# Patient Record
Sex: Female | Born: 1980 | Race: Black or African American | Hispanic: No | Marital: Single | Smoking: Former smoker
Health system: Southern US, Community
[De-identification: ages and names within clinical notes are randomized; demographics above are authoritative.]

## PROBLEM LIST (undated history)

## (undated) DIAGNOSIS — R519 Headache, unspecified: Secondary | ICD-10-CM

## (undated) DIAGNOSIS — IMO0001 Reserved for inherently not codable concepts without codable children: Secondary | ICD-10-CM

## (undated) DIAGNOSIS — D649 Anemia, unspecified: Secondary | ICD-10-CM

## (undated) DIAGNOSIS — O039 Complete or unspecified spontaneous abortion without complication: Secondary | ICD-10-CM

## (undated) HISTORY — DX: Complete or unspecified spontaneous abortion without complication: O03.9

## (undated) HISTORY — DX: Reserved for inherently not codable concepts without codable children: IMO0001

## (undated) HISTORY — PX: NO PAST SURGERIES: SHX2092

---

## 2004-04-01 ENCOUNTER — Emergency Department (HOSPITAL_COMMUNITY): Admission: EM | Admit: 2004-04-01 | Discharge: 2004-04-01 | Payer: Self-pay | Admitting: Family Medicine

## 2005-08-24 ENCOUNTER — Emergency Department (HOSPITAL_COMMUNITY): Admission: EM | Admit: 2005-08-24 | Discharge: 2005-08-24 | Payer: Self-pay | Admitting: Emergency Medicine

## 2005-08-28 ENCOUNTER — Emergency Department: Payer: Self-pay | Admitting: Emergency Medicine

## 2005-09-02 ENCOUNTER — Emergency Department: Payer: Self-pay | Admitting: Emergency Medicine

## 2006-04-23 ENCOUNTER — Inpatient Hospital Stay: Payer: Self-pay

## 2006-04-23 ENCOUNTER — Observation Stay: Payer: Self-pay

## 2006-04-26 ENCOUNTER — Emergency Department: Payer: Self-pay | Admitting: Emergency Medicine

## 2006-04-27 ENCOUNTER — Ambulatory Visit: Payer: Self-pay | Admitting: Anesthesiology

## 2006-04-27 ENCOUNTER — Emergency Department: Payer: Self-pay | Admitting: Emergency Medicine

## 2008-07-25 IMAGING — CT CT HEAD WITHOUT CONTRAST
2 series · 16 of 30 positions shown, 20 images · non-contrast
Comparison: none

REASON FOR EXAM: Headache
COMMENTS:

PROCEDURE:     CT  - CT HEAD WITHOUT CONTRAST  - April 27, 2006  [DATE]
RESULT:     Axial, unenhanced head CT was obtained on an emergency basis.

[Series 2: without · axial · non-contrast · 0.39mm/px · z∈[+280,+400]mm · 13 of 28 slices shown, 17 images]
[im 2/28  brain]
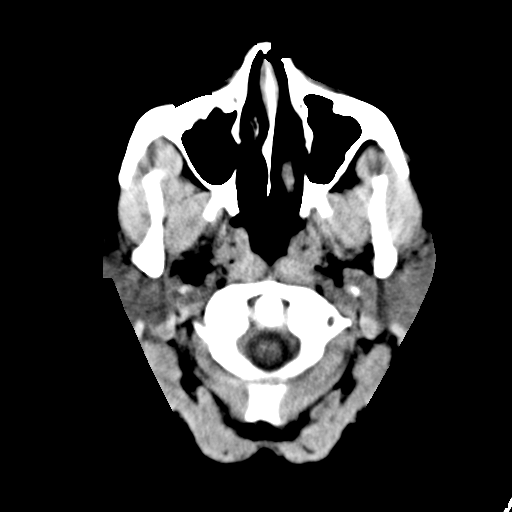
[im 2/28  bone]
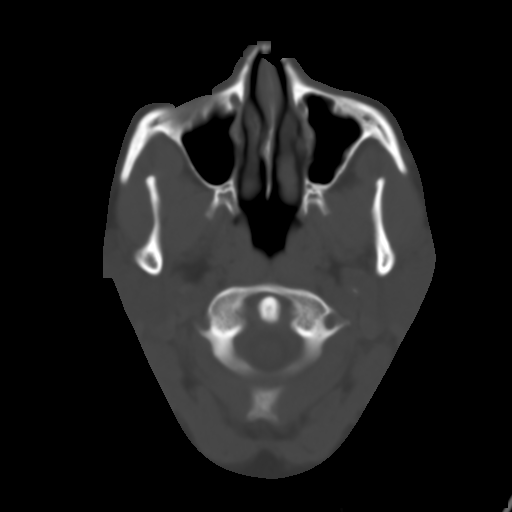
[im 4/28  brain]
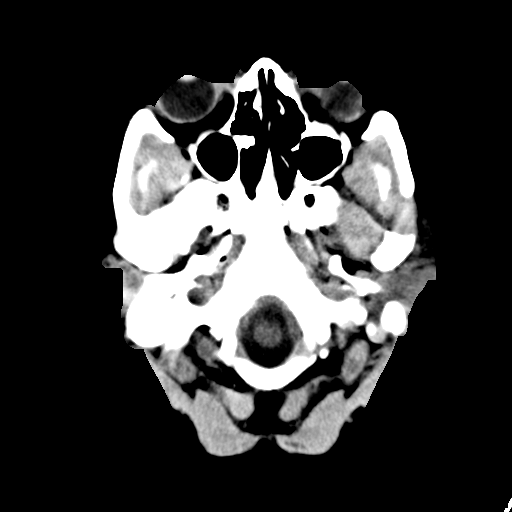
[im 6/28  brain]
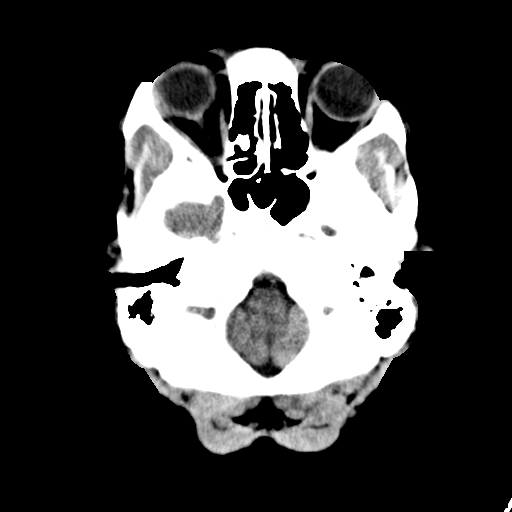
[im 8/28  brain]
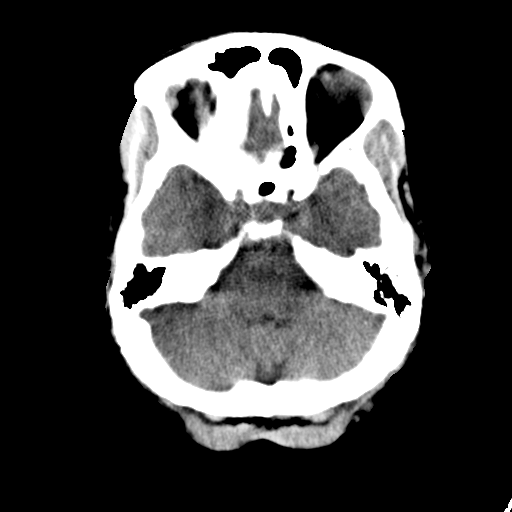
[im 10/28  brain]
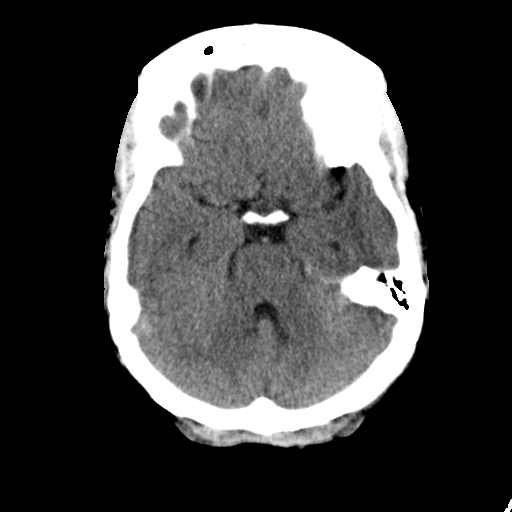
[im 10/28  bone]
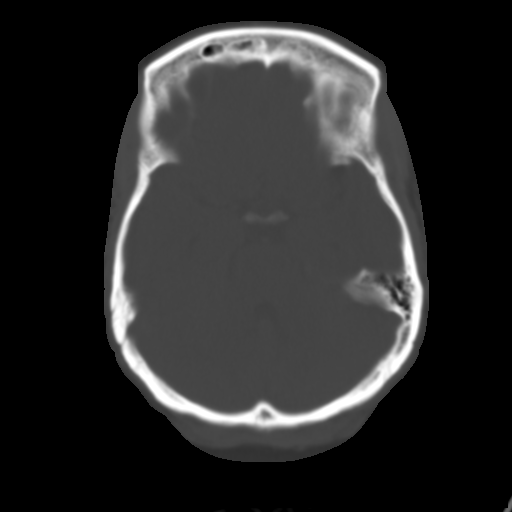
[im 12/28  brain]
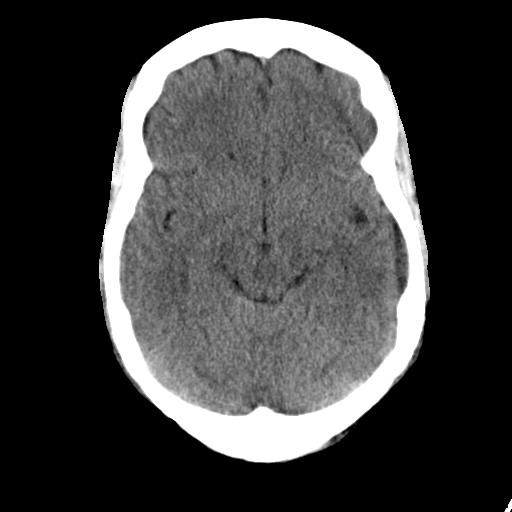
[im 14/28  brain]
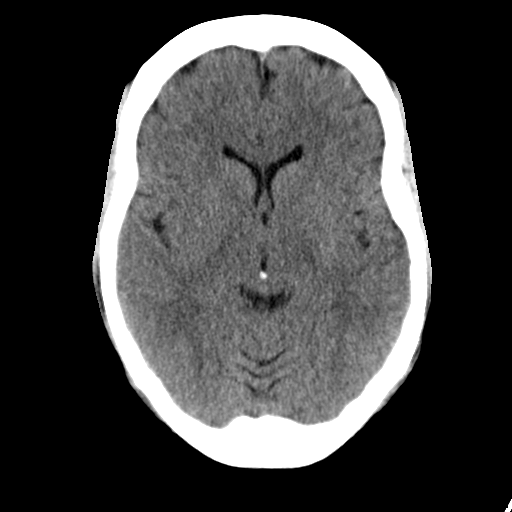
[im 16/28  brain]
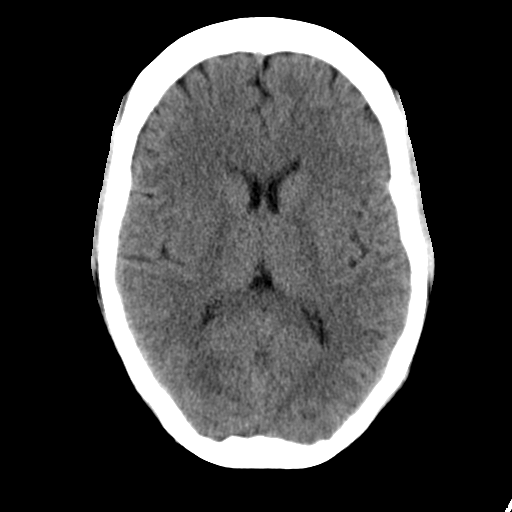
[im 18/28  brain]
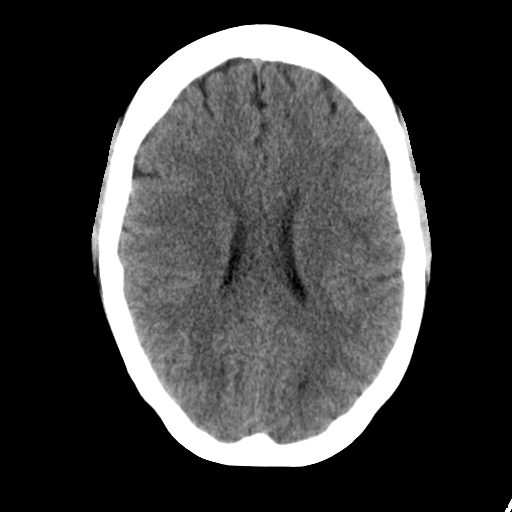
[im 18/28  bone]
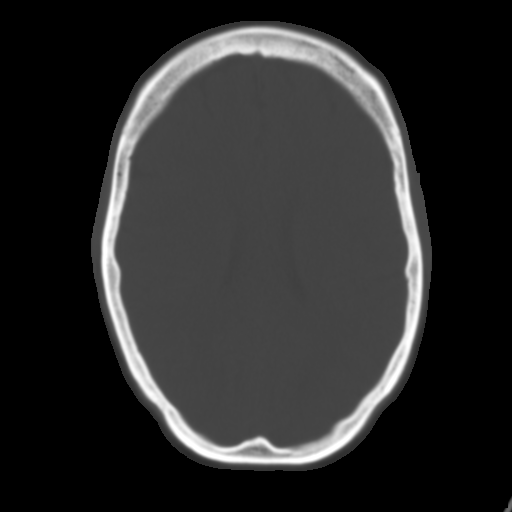
[im 20/28  brain]
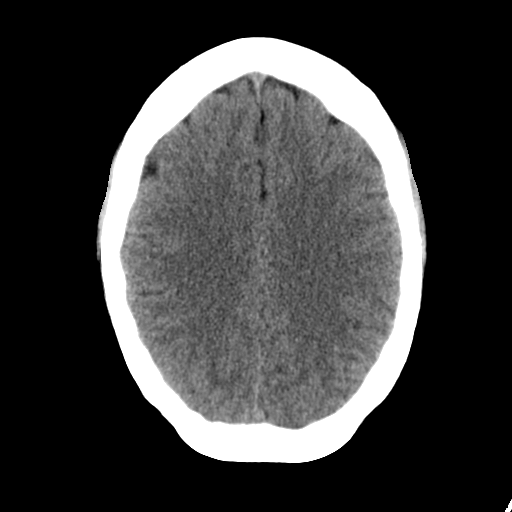
[im 22/28  brain]
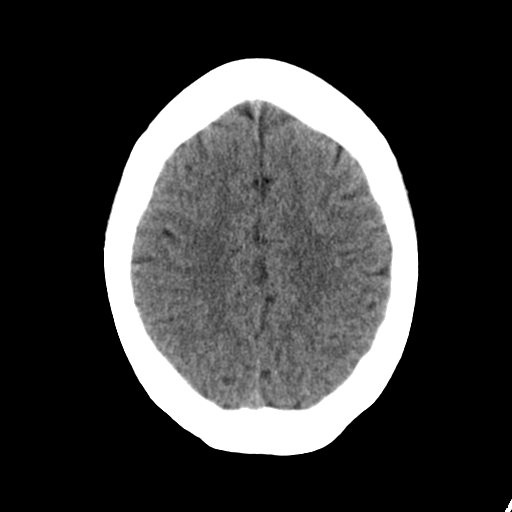
[im 24/28  brain]
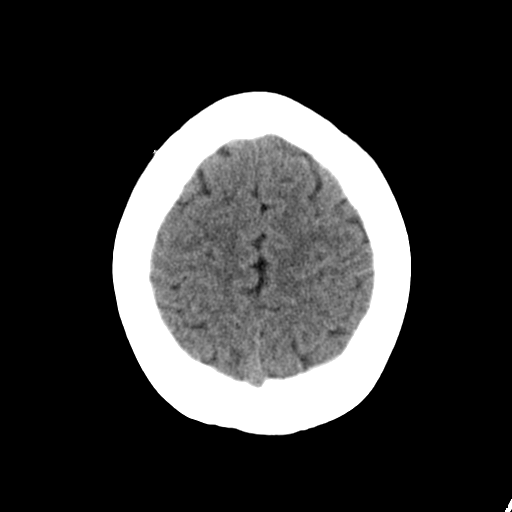
[im 26/28  brain]
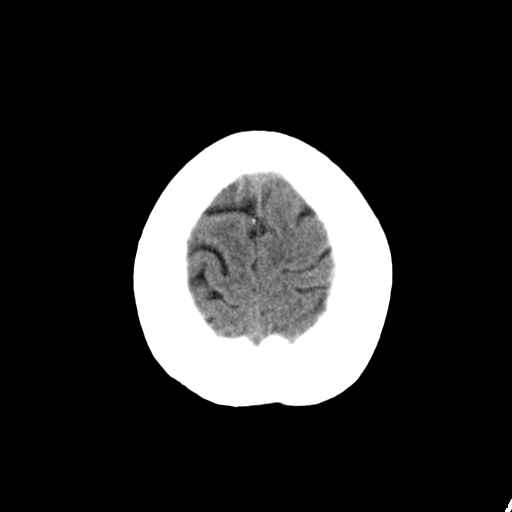
[im 26/28  bone]
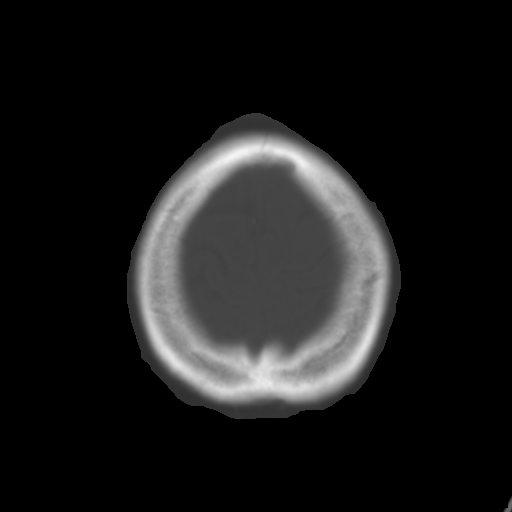

[Series 3: bone · axial · 0.39mm/px · z∈[+280,+320]mm · 3 of 28 slices shown]
[im 2/28  bone]
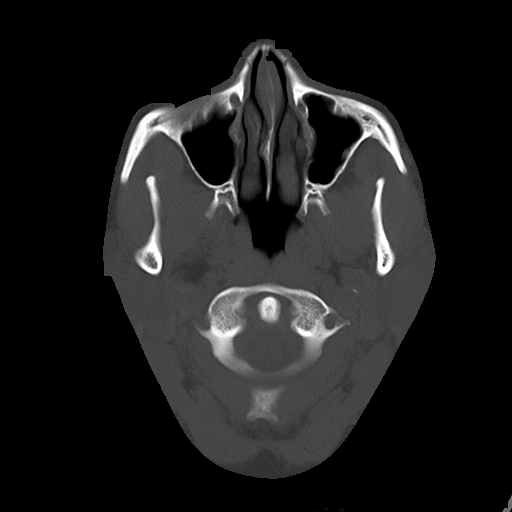
[im 6/28  bone]
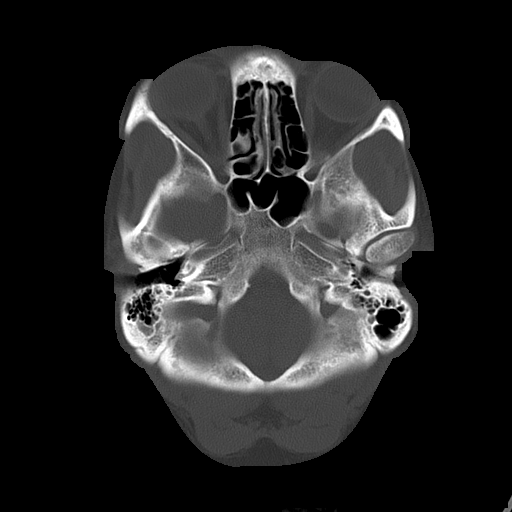
[im 10/28  bone]
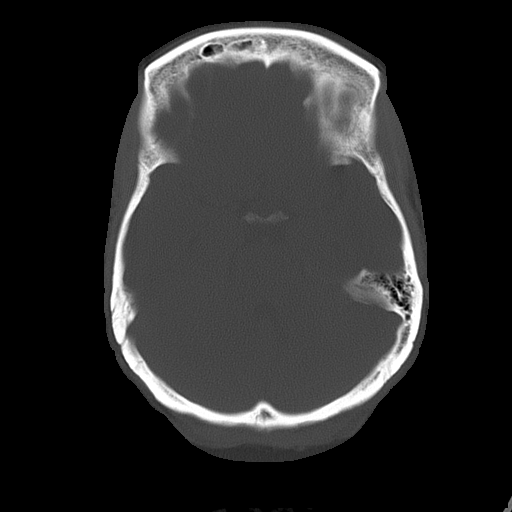

[16 of 30 positions shown; findings below may reference images not displayed]

FINDINGS: No subarachnoid hemorrhage is seen. There are no intracerebral
bleeds. No mass effect or shift of the midline is identified. No extra-axial
fluid collections are noted. The ventricles appear within normal limits.

On the bone window settings, there is some minimal mucoperiosteal thickening
in the LEFT maxillary sinus. The remainder of the sinuses is clear.
IMPRESSION: 1.     No intracranial abnormalities are identified.
2.     Chronic LEFT maxillary sinusitis.

## 2009-07-30 ENCOUNTER — Emergency Department: Payer: Self-pay | Admitting: Emergency Medicine

## 2010-11-10 ENCOUNTER — Emergency Department: Payer: Self-pay | Admitting: Unknown Physician Specialty

## 2011-04-07 ENCOUNTER — Emergency Department: Payer: Self-pay | Admitting: Emergency Medicine

## 2012-12-09 ENCOUNTER — Emergency Department: Payer: Self-pay | Admitting: Emergency Medicine

## 2012-12-09 LAB — URINALYSIS, COMPLETE
Glucose,UR: NEGATIVE mg/dL (ref 0–75)
Ketone: NEGATIVE
Leukocyte Esterase: NEGATIVE
Nitrite: NEGATIVE
Ph: 7 (ref 4.5–8.0)
Protein: NEGATIVE
Squamous Epithelial: 1
WBC UR: 1 /HPF (ref 0–5)

## 2017-03-02 ENCOUNTER — Ambulatory Visit: Payer: Self-pay | Attending: Oncology

## 2017-04-13 ENCOUNTER — Ambulatory Visit: Payer: Self-pay

## 2017-04-20 ENCOUNTER — Ambulatory Visit: Payer: Self-pay | Attending: Oncology

## 2019-02-16 NOTE — L&D Delivery Note (Signed)
Delivery Note Pt pushed x 17 minutes w/ Dr. Macon Large at [facility]. Late decels w/ moderate variability persisted, but pt pushed very well. NICU team at [facility]. At 12:39 PM a viable and healthy female was delivered via Vaginal, Spontaneous (Presentation: Right Occiput Anterior).  APGAR: 6, 9; weight 6 lb 3.3 oz (2815 g).  Cord clamped x 2 immediately after delivery and baby taken to warmer for NICU eval due to decels, but returned to Mom's abd for Skin-to-skin soon after.    Placenta status: Spontaneous;Pathology, Intact.  Cord: 3 vessels with the following complications: None.  Cord pH: pending.  Edematous cervix visable at introitus.   Anesthesia: Epidural Episiotomy: None Lacerations: None Suture Repair: NA Est. Blood Loss (mL): 50  IO cath 50 ml amber urine.  - CMET - LR bolus - CTO urine OP closely   Mom to postpartum.  Baby to [facility] 11/25/2019, 3:19 PM

## 2019-05-24 ENCOUNTER — Ambulatory Visit (INDEPENDENT_AMBULATORY_CARE_PROVIDER_SITE_OTHER): Payer: Medicaid Other | Admitting: Advanced Practice Midwife

## 2019-05-24 ENCOUNTER — Ambulatory Visit (INDEPENDENT_AMBULATORY_CARE_PROVIDER_SITE_OTHER): Payer: Medicaid Other

## 2019-05-24 ENCOUNTER — Encounter: Payer: Self-pay | Admitting: Advanced Practice Midwife

## 2019-05-24 ENCOUNTER — Other Ambulatory Visit (HOSPITAL_COMMUNITY)
Admission: RE | Admit: 2019-05-24 | Discharge: 2019-05-24 | Disposition: A | Discharge status: Home / Self Care | Payer: Medicaid Other | Source: Ambulatory Visit | Attending: Advanced Practice Midwife | Admitting: Advanced Practice Midwife

## 2019-05-24 ENCOUNTER — Other Ambulatory Visit: Payer: Self-pay

## 2019-05-24 VITALS — BP 118/74 | Ht 60.0 in | Wt 124.0 lb

## 2019-05-24 DIAGNOSIS — O0991 Supervision of high risk pregnancy, unspecified, first trimester: Secondary | ICD-10-CM | POA: Diagnosis present

## 2019-05-24 DIAGNOSIS — O09891 Supervision of other high risk pregnancies, first trimester: Secondary | ICD-10-CM | POA: Diagnosis not present

## 2019-05-24 DIAGNOSIS — Z3689 Encounter for other specified antenatal screening: Secondary | ICD-10-CM | POA: Diagnosis not present

## 2019-05-24 DIAGNOSIS — O09521 Supervision of elderly multigravida, first trimester: Secondary | ICD-10-CM | POA: Diagnosis not present

## 2019-05-24 DIAGNOSIS — Z113 Encounter for screening for infections with a predominantly sexual mode of transmission: Secondary | ICD-10-CM | POA: Diagnosis present

## 2019-05-24 DIAGNOSIS — Z3A14 14 weeks gestation of pregnancy: Secondary | ICD-10-CM

## 2019-05-24 DIAGNOSIS — O09529 Supervision of elderly multigravida, unspecified trimester: Secondary | ICD-10-CM | POA: Insufficient documentation

## 2019-05-24 DIAGNOSIS — Z3A13 13 weeks gestation of pregnancy: Secondary | ICD-10-CM

## 2019-05-24 NOTE — Progress Notes (Signed)
NOB today.  LMP 02/22/2019

## 2019-05-24 NOTE — Progress Notes (Signed)
New Obstetric Patient H&P    Chief Complaint: "Desires prenatal care"   History of Present Illness: Patient is a 39 y.o. G3P1011 Not Hispanic or Latino female, presents with amenorrhea and positive home pregnancy test. Patient's last menstrual period was 02/18/2019 (within days). and based on her  LMP, her EDD is Estimated Date of Delivery: 11/25/19 and her EGA is [redacted]w[redacted]d. Cycles are 4. days, regular, and occur approximately every : 28 days. By dating scan today patient is 105w4d. No adjustment of EDD for 7 day difference. Her last pap smear was 1 week ago at [facility]. Awaiting results. She reports a history of abnormal PAP in the past followed by normal.   She had a urine pregnancy test which was positive 7 week(s)  ago. Her last menstrual period was normal and lasted for  4 day(s). Since her LMP she claims she has experienced breast tenderness, fatigue, nausea, vomiting. She denies vaginal bleeding. Her past medical history is noncontributory. Her prior pregnancies are notable for G1 TAB in 2000, G2 FT SVD 5#6oz Female 2008  Since her LMP, she admits to the use of tobacco products  Yes currently 5 c/d/trying to quit She claims she has gained   5 pounds since the start of her pregnancy.  There are cats in the home in the home  no  She admits close contact with children on a regular basis  yes  She has had chicken pox in the past yes She has had Tuberculosis exposures, symptoms, or previously tested positive for TB   no Current or past history of domestic violence. no  Genetic Screening/Teratology Counseling: (Includes patient, baby's father, or anyone in either family with:)   1. Patient's age >/= 5 at [facility]  yes 2. Thalassemia ([place], [place], Mediterranean, or Asian background): MCV<80  no 3. Neural tube defect (meningomyelocele, spina bifida, anencephaly)  no 4. Congenital heart defect  no  5. Down syndrome  no 6. Tay-Sachs (Jewish, [place]  no 7. Canavan's Disease  no 8.  Sickle cell disease or trait (African)  no  9. Hemophilia or other blood disorders  no  10. Muscular dystrophy  no  11. Cystic fibrosis  no  12. Huntington's Chorea  no  13. Mental retardation/autism  no 14. Other inherited genetic or chromosomal disorder  no 15. Maternal metabolic disorder (DM, PKU, etc)  no 16. Patient or FOB with a child with a birth defect not listed above no  16a. Patient or FOB with a birth defect themselves no 17. Recurrent pregnancy loss, or stillbirth  no  18. Any medications since LMP other than prenatal vitamins (include vitamins, supplements, OTC meds, drugs, alcohol)  no 19. Any other genetic/environmental exposure to discuss  no  Infection History:   1. Lives with someone with TB or TB exposed  no  2. Patient or partner has history of genital herpes  no 3. Rash or viral illness since LMP  no 4. History of STI (GC, CT, HPV, syphilis, HIV)  no 5. 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 Supervision of high risk pregnancy in first trimester 05/24/2019    Clinic [facility] Prenatal Labs  Dating  Blood type:     Genetic Screen 1 Screen:  AFP:     Quad:     NIPS: Antibody:   Anatomic [place]  Rubella:   Varicella: @VZVIGG @  GTT Third trimester:  RPR:     Rhogam  HBsAg:     Vaccines TDAP:                       Flu Shot: HIV:     Baby Food                                GBS:   Contraception  Pap: April 2021 @ May 2021  CBB     CS/VBAC NA AMA  Support Person [place]       . Advanced maternal age in multigravida 05/24/2019    Past Surgical History:  Past Surgical History:  Procedure Laterality Date  . NO PAST SURGERIES      Gynecologic History: Patient's last menstrual period was 02/18/2019 (within days).  Obstetric History:  G3P1011  Family History:  History reviewed. No pertinent family history.  Social History:  Social History   Socioeconomic History  . Marital status: Single    Spouse name: Not on file  . Number of children: Not on file  . Years of education: Not on file  . Highest education level: Not on file  Occupational History  . Not on file  Tobacco Use  . Smoking status: Current Every Day Smoker    Types: Cigarettes  . Smokeless tobacco: Never Used  Substance and Sexual Activity  . Alcohol use: Not Currently  . Drug use: Not Currently  . Sexual activity: Yes    Birth control/protection: None  Other Topics Concern  . Not on file  Social History Narrative  . Not on file   Social Determinants of Health   Financial Resource Strain:   . Difficulty of Paying Living Expenses:   Food Insecurity:   . Worried About 04/18/2019 in the Last Year:   . Programme researcher, broadcasting/film/video in the Last Year:   Transportation Needs:   . Barista (Medical):   Freight forwarder Lack of Transportation (Non-Medical):   Physical Activity:   . Days of Exercise per Week:   . Minutes of Exercise per Session:   Stress:   . Feeling of Stress :   Social Connections:   . Frequency of Communication with Friends and Family:   . Frequency of Social Gatherings with Friends and Family:   . Attends Religious Services:   . Active Member of Clubs or Organizations:   . Attends [employer] Meetings:   Banker Marital Status:   Intimate Partner Violence:   . Fear of Current or Ex-Partner:   . Emotionally Abused:   [employer] Physically Abused:   . Sexually Abused:     Allergies:  Allergies  Allergen Reactions  . Penicillins     Medications: Prior to Admission medications   Medication Sig Start Date End Date Taking? Authorizing Provider  Prenatal Vit-Fe Fumarate-FA (PRENATAL VITAMIN PO) Take by mouth.   Yes [provider]    Physical Exam Vitals: Blood pressure 118/74, height 5' (1.524 m), weight 124 lb  (56.2 kg), last menstrual period 02/18/2019.  General: NAD HEENT: normocephalic, anicteric Thyroid: no enlargement, no palpable nodules Pulmonary: No increased work of breathing, CTAB Cardiovascular: RRR, distal pulses 2+ Abdomen: NABS, soft, non-tender, non-distended.  Umbilicus without lesions.  No hepatomegaly, splenomegaly or masses palpable.  No evidence of hernia, FHTs 140s  Genitourinary:  External: Normal external female genitalia.  Normal urethral meatus, normal  Bartholin's and Skene's glands.    Vagina: Normal vaginal mucosa, no evidence of prolapse.    Cervix: Grossly normal in appearance, no bleeding, no CMT  Uterus: Enlarged, mobile, normal contour.    Adnexa: ovaries non-enlarged, no adnexal masses  Rectal: deferred Extremities: no edema, erythema, or tenderness Neurologic: Grossly intact Psychiatric: mood appropriate, affect full  The following were addressed during this visit:  Breastfeeding Education - Early initiation of breastfeeding    Comments: Keeps milk supply adequate, helps contract uterus and slow bleeding, and early milk is the perfect first food and is easy to digest.   - The importance of exclusive breastfeeding    Comments: Provides antibodies, Lower risk of breast and ovarian cancers, and type-2 diabetes,Helps your body recover, Reduced chance of SIDS.   - Risks of giving your baby anything other than breast milk if you are breastfeeding    Comments: Make the baby less content with breastfeeds, may make my baby more susceptible to illness, and may reduce my milk supply.   - The importance of early skin-to-skin contact    Comments: Keeps baby warm and secure, helps keep baby's blood sugar up and breathing steady, easier to bond and breastfeed, and helps calm baby.  - Rooming-in on a 24-hour basis    Comments: Easier to learn baby's feeding cues, easier to bond and get to know each other, and encourages milk production.   - Feeding on demand or  baby-led feeding    Comments: Helps prevent breastfeeding complications, helps bring in good milk supply, prevents under or overfeeding, and helps baby feel content and satisfied   - Frequent feeding to help assure optimal milk production    Comments: Making a full supply of milk requires frequent removal of milk from breasts, infant will eat 8-12 times in 24 hours, if separated from infant use breast massage, hand expression and/ or pumping to remove milk from breasts.   - Effective positioning and attachment    Comments: Helps my baby to get enough breast milk, helps to produce an adequate milk supply, and helps prevent nipple pain and damage   - Exclusive breastfeeding for the first 6 months    Comments: Builds a healthy milk supply and keeps it up, protects baby from sickness and disease, and breastmilk has everything your baby needs for the first 6 months.    Assessment: 39 y.o. G3P1011 at [redacted]w[redacted]d presenting to initiate prenatal care  Plan: 1) Avoid alcoholic beverages. 2) Patient encouraged not to smoke.  3) Discontinue the use of all non-medicinal drugs and chemicals.  4) Take prenatal vitamins daily.  5) Nutrition, food safety (fish, cheese advisories, and high nitrite foods) and exercise discussed. 6) Hospital and practice style discussed with cross coverage system.  7) Genetic Screening, such as with 1st Trimester Screening, cell free fetal DNA, AFP testing, and Ultrasound, as well as with amniocentesis and CVS as appropriate, is discussed with patient. At the conclusion of today's visit patient requested cell free DNA genetic testing 8) Patient is asked about travel to areas at risk for the Zika virus, and counseled to avoid travel and exposure to mosquitoes or sexual partners who may have themselves been exposed to the virus. Testing is discussed, and will be ordered as appropriate.  9) ultrasound today for dating at 11:30, aptima, NOB panel, sickle cell screen, urine culture,  uds today. Will do  labs after u/s including MaterniT 21 if dating >10 weeks 10) Return to clinic in 4 weeks for rob   Rod Can, [facility] 05/24/2019, 10:32 AM

## 2019-05-24 NOTE — Patient Instructions (Signed)
Exercise During Pregnancy Exercise is an important part of being healthy for people of all ages. Exercise improves the function of your heart and lungs and helps you maintain strength, flexibility, and a healthy body weight. Exercise also boosts energy levels and elevates mood. Most women should exercise regularly during pregnancy. In rare cases, women with certain medical conditions or complications may be asked to limit or avoid exercise during pregnancy. How does this affect me? Along with maintaining general strength and flexibility, exercising during pregnancy can help:  Keep strength in muscles that are used during labor and childbirth.  Decrease low back pain.  Reduce symptoms of depression.  Control weight gain during pregnancy.  Reduce the risk of needing insulin if you develop diabetes during pregnancy.  Decrease the risk of cesarean delivery.  Speed up your recovery after giving birth. How does this affect my baby? Exercise can help you have a healthy pregnancy. Exercise does not cause premature birth. It will not cause your baby to weigh less at birth. What exercises can I do? Many exercises are safe for you to do during pregnancy. Do a variety of exercises that safely increase your heart and breathing rates and help you build and maintain muscle strength. Do exercises exactly as told by your health care provider. You may do these exercises:  Walking or hiking.  Swimming.  Water aerobics.  Riding a stationary bike.  Strength training.  Modified yoga or Pilates. Tell your instructor that you are pregnant. Avoid overstretching, and avoid lying on your back for long periods of time.  Running or jogging. Only choose this type of exercise if you: ? Ran or jogged regularly before your pregnancy. ? Can run or jog and still talk in complete sentences. What exercises should I avoid? Depending on your level of fitness and whether you exercised regularly before your  pregnancy, you may be told to limit high-intensity exercise. You can tell that you are exercising at a high intensity if you are breathing much harder and faster and cannot hold a conversation while exercising. You must avoid:  Contact sports.  Activities that put you at risk for falling on or being hit in the belly, such as downhill skiing, water skiing, surfing, rock climbing, cycling, gymnastics, and horseback riding.  Scuba diving.  Skydiving.  Yoga or Pilates in a room that is heated to high temperatures.  Jogging or running, unless you ran or jogged regularly before your pregnancy. While jogging or running, you should always be able to talk in full sentences. Do not run or jog so fast that you are unable to have a conversation.  Do not exercise at more than 6,000 feet above sea level (high elevation) if you are not used to exercising at high elevation. How do I exercise in a safe way?   Avoid overheating. Do not exercise in very high temperatures.  Wear loose-fitting, breathable clothes.  Avoid dehydration. Drink enough water before, during, and after exercise to keep your urine pale yellow.  Avoid overstretching. Because of hormone changes during pregnancy, it is easy to overstretch muscles, tendons, and ligaments during pregnancy.  Start slowly and ask your health care provider to recommend the types of exercise that are safe for you.  Do not exercise to lose weight. Follow these instructions at home:  Exercise on most days or all days of the week. Try to exercise for 30 minutes a day, 5 days a week, unless your health care provider tells you not to.  If   you actively exercised before your pregnancy and you are healthy, your health care provider may tell you to continue to do moderate to high-intensity exercise.  If you are just starting to exercise or did not exercise much before your pregnancy, your health care provider may tell you to do low to moderate-intensity  exercise. Questions to ask your health care provider  Is exercise safe for me?  What are signs that I should stop exercising?  Does my health condition mean that I should not exercise during pregnancy?  When should I avoid exercising during pregnancy? Stop exercising and contact a health care provider if: You have any unusual symptoms, such as:  Mild contractions of the uterus or cramps in the abdomen.  Dizziness that does not go away when you rest. Stop exercising and get help right away if: You have any unusual symptoms, such as:  Sudden, severe pain in your low back or your belly.  Mild contractions of the uterus or cramps in the abdomen that do not improve with rest and drinking fluids.  Chest pain.  Bleeding or fluid leaking from your vagina.  Shortness of breath. These symptoms may represent a serious problem that is an emergency. Do not wait to see if the symptoms will go away. Get medical help right away. Call your local emergency services (911 in the [place] Do not drive yourself to the hospital. Summary  Most women should exercise regularly throughout pregnancy. In rare cases, women with certain medical conditions or complications may be asked to limit or avoid exercise during pregnancy.  Do not exercise to lose weight during pregnancy.  Your health care provider will tell you what level of physical activity is right for you.  Stop exercising and contact a health care provider if you have mild contractions of the uterus or cramps in the abdomen. Get help right away if these contractions or cramps do not improve with rest and drinking fluids.  Stop exercising and get help right away if you have sudden, severe pain in your low back or belly, chest pain, shortness of breath, or bleeding or leaking of fluid from your vagina. This information is not intended to replace advice given to you by your health care provider. Make sure you discuss any questions you have with your  health care provider. Document Revised: 05/25/2018 Document Reviewed: 03/08/2018 Elsevier Patient Education  2020 [employer] Eating Plan for Pregnant Women While you are pregnant, your body requires additional nutrition to help support your growing baby. You also have a higher need for some vitamins and minerals, such as folic acid, calcium, iron, and vitamin D. Eating a healthy, well-balanced diet is very important for your health and your baby's health. Your need for extra calories varies for the three 3-month segments of your pregnancy (trimesters). For most women, it is recommended to consume:  150 extra calories a day during the first trimester.  300 extra calories a day during the second trimester.  300 extra calories a day during the third trimester. What are tips for following this plan?   Do not try to lose weight or go on a diet during pregnancy.  Limit your overall intake of foods that have "empty calories." These are foods that have little nutritional value, such as sweets, desserts, candies, and sugar-sweetened beverages.  Eat a variety of foods (especially fruits and vegetables) to get a full range of vitamins and minerals.  Take a prenatal vitamin to help meet your additional vitamin and mineral needs   during pregnancy, specifically for folic acid, iron, calcium, and vitamin D.  Remember to stay active. Ask your health care provider what types of exercise and activities are safe for you.  Practice good food safety and cleanliness. Wash your hands before you eat and after you prepare raw meat. Wash all fruits and vegetables well before peeling or eating. Taking these actions can help to prevent food-borne illnesses that can be very dangerous to your baby, such as listeriosis. Ask your health care provider for more information about listeriosis. What does 150 extra calories look like? Healthy options that provide 150 extra calories each day could be any of the  following:  6-8 oz (170-230 g) of plain low-fat yogurt with  cup of berries.  1 apple with 2 teaspoons (11 g) of peanut butter.  Cut-up vegetables with  cup (60 g) of hummus.  8 oz (230 mL) or 1 cup of low-fat chocolate milk.  1 stick of string cheese with 1 medium orange.  1 peanut butter and jelly sandwich that is made with one slice of whole-wheat bread and 1 tsp (5 g) of peanut butter. For 300 extra calories, you could eat two of those healthy options each day. What is a healthy amount of weight to gain? The right amount of weight gain for you is based on your BMI before you became pregnant. If your BMI:  Was less than 18 (underweight), you should gain 28-40 lb (13-18 kg).  Was 18-24.9 (normal), you should gain 25-35 lb (11-16 kg).  Was 25-29.9 (overweight), you should gain 15-25 lb (7-11 kg).  Was 30 or greater (obese), you should gain 11-20 lb (5-9 kg). What if I am having twins or multiples? Generally, if you are carrying twins or multiples:  You may need to eat 300-600 extra calories a day.  The recommended range for total weight gain is 25-54 lb (11-25 kg), depending on your BMI before pregnancy.  Talk with your health care provider to find out about nutritional needs, weight gain, and exercise that is right for you. What foods can I eat?  Fruits All fruits. Eat a variety of colors and types of fruit. Remember to wash your fruits well before peeling or eating. Vegetables All vegetables. Eat a variety of colors and types of vegetables. Remember to wash your vegetables well before peeling or eating. Grains All grains. Choose whole grains, such as whole-wheat bread, oatmeal, or brown rice. Meats and other protein foods Lean meats, including chicken, turkey, fish, and lean cuts of beef, veal, or pork. If you eat fish or seafood, choose options that are higher in omega-3 fatty acids and lower in mercury, such as salmon, herring, mussels, trout, sardines, pollock,  shrimp, crab, and lobster. Tofu. Tempeh. Beans. Eggs. Peanut butter and other nut butters. Make sure that all meats, poultry, and eggs are cooked to food-safe temperatures or "well-done." Two or more servings of fish are recommended each week in order to get the most benefits from omega-3 fatty acids that are found in seafood. Choose fish that are lower in mercury. You can find more information online:  www.fda.gov Dairy Pasteurized milk and milk alternatives (such as almond milk). Pasteurized yogurt and pasteurized cheese. Cottage cheese. Sour cream. Beverages Water. Juices that contain 100% fruit juice or vegetable juice. Caffeine-free teas and decaffeinated coffee. Drinks that contain caffeine are okay to drink, but it is better to avoid caffeine. Keep your total caffeine intake to less than 200 mg each day (which is 12 oz   or 355 mL of coffee, tea, or soda) or the limit as told by your health care provider. Fats and oils Fats and oils are okay to include in moderation. Sweets and desserts Sweets and desserts are okay to include in moderation. Seasoning and other foods All pasteurized condiments. The items listed above may not be a complete list of foods and beverages you can eat. Contact a dietitian for more information. What foods are not recommended? Fruits Unpasteurized fruit juices. Vegetables Raw (unpasteurized) vegetable juices. Meats and other protein foods Lunch meats, bologna, hot dogs, or other deli meats. (If you must eat those meats, reheat them until they are steaming hot.) Refrigerated pat, meat spreads from a meat counter, smoked seafood that is found in the refrigerated section of a store. Raw or undercooked meats, poultry, and eggs. Raw fish, such as sushi or sashimi. Fish that have high mercury content, such as tilefish, shark, swordfish, and king mackerel. To learn more about mercury in fish, talk with your health care provider or look for online resources, such  as:  www.fda.gov Dairy Raw (unpasteurized) milk and any foods that have raw milk in them. Soft cheeses, such as feta, queso blanco, queso fresco, Brie, Camembert cheeses, blue-veined cheeses, and Panela cheese (unless it is made with pasteurized milk, which must be stated on the label). Beverages Alcohol. Sugar-sweetened beverages, such as sodas, teas, or energy drinks. Seasoning and other foods Homemade fermented foods and drinks, such as pickles, sauerkraut, or kombucha drinks. (Store-bought pasteurized versions of these are okay.) Salads that are made in a store or deli, such as ham salad, chicken salad, egg salad, tuna salad, and seafood salad. The items listed above may not be a complete list of foods and beverages you should avoid. Contact a dietitian for more information. Where to find more information To calculate the number of calories you need based on your height, weight, and activity level, you can use an online calculator such as:  www.choosemyplate.gov/MyPlatePlan To calculate how much weight you should gain during pregnancy, you can use an online pregnancy weight gain calculator such as:  www.choosemyplate.gov/pregnancy-weight-gain-calculator Summary  While you are pregnant, your body requires additional nutrition to help support your growing baby.  Eat a variety of foods, especially fruits and vegetables to get a full range of vitamins and minerals.  Practice good food safety and cleanliness. Wash your hands before you eat and after you prepare raw meat. Wash all fruits and vegetables well before peeling or eating. Taking these actions can help to prevent food-borne illnesses, such as listeriosis, that can be very dangerous to your baby.  Do not eat raw meat or fish. Do not eat fish that have high mercury content, such as tilefish, shark, swordfish, and king mackerel. Do not eat unpasteurized (raw) dairy.  Take a prenatal vitamin to help meet your additional vitamin and  mineral needs during pregnancy, specifically for folic acid, iron, calcium, and vitamin D. This information is not intended to replace advice given to you by your health care provider. Make sure you discuss any questions you have with your health care provider. Document Revised: 06/22/2018 Document Reviewed: 10/29/2016 Elsevier Patient Education  2020 Else[employer] Inc. Prenatal Care Prenatal care is health care during pregnancy. It helps you and your unborn baby (fetus) stay as healthy as possible. Prenatal care may be provided by a midwife, a family practice health care provider, or a childbirth and pregnancy specialist (obstetrician). How does this affect me? During pregnancy, you will be closely monitored   for any new conditions that might develop. To lower your risk of pregnancy complications, you and your health care provider will talk about any underlying conditions you have. How does this affect my baby? Early and consistent prenatal care increases the chance that your baby will be healthy during pregnancy. Prenatal care lowers the risk that your baby will be:  Born early (prematurely).  Smaller than expected at birth (small for gestational age). What can I expect at the first prenatal care visit? Your first prenatal care visit will likely be the longest. You should schedule your first prenatal care visit as soon as you know that you are pregnant. Your first visit is a good time to talk about any questions or concerns you have about pregnancy. At your visit, you and your health care provider will talk about:  Your medical history, including: ? Any past pregnancies. ? Your family's medical history. ? The baby's father's medical history. ? Any long-term (chronic) health conditions you have and how you manage them. ? Any surgeries or procedures you have had. ? Any current over-the-counter or prescription medicines, herbs, or supplements you are taking.  Other factors that could pose a risk  to your baby, including:  Your home setting and your stress levels, including: ? Exposure to abuse or violence. ? Household financial strain. ? Mental health conditions you have.  Your daily health habits, including diet and exercise. Your health care provider will also:  Measure your weight, height, and blood pressure.  Do a physical exam, including a pelvic and breast exam.  Perform blood tests and urine tests to check for: ? Urinary tract infection. ? Sexually transmitted infections (STIs). ? Low iron levels in your blood (anemia). ? Blood type and certain proteins on red blood cells (Rh antibodies). ? Infections and immunity to viruses, such as hepatitis B and rubella. ? HIV (human immunodeficiency virus).  Do an ultrasound to confirm your baby's growth and development and to help predict your estimated due date (EDD). This ultrasound is done with a probe that is inserted into the vagina (transvaginal ultrasound).  Discuss your options for genetic screening.  Give you information about how to keep yourself and your baby healthy, including: ? Nutrition and taking vitamins. ? Physical activity. ? How to manage pregnancy symptoms such as nausea and vomiting (morning sickness). ? Infections and substances that may be harmful to your baby and how to avoid them. ? Food safety. ? Dental care. ? Working. ? Travel. ? Warning signs to watch for and when to call your health care provider. How often will I have prenatal care visits? After your first prenatal care visit, you will have regular visits throughout your pregnancy. The visit schedule is often as follows:  Up to week 28 of pregnancy: once every 4 weeks.  28-36 weeks: once every 2 weeks.  After 36 weeks: every week until delivery. Some women may have visits more or less often depending on any underlying health conditions and the health of the baby. Keep all follow-up and prenatal care visits as told by your health care  provider. This is important. What happens during routine prenatal care visits? Your health care provider will:  Measure your weight and blood pressure.  Check for fetal heart sounds.  Measure the height of your uterus in your abdomen (fundal height). This may be measured starting around week 20 of pregnancy.  Check the position of your baby inside your uterus.  Ask questions about your diet, sleeping patterns, and   whether you can feel the baby move.  Review warning signs to watch for and signs of labor.  Ask about any pregnancy symptoms you are having and how you are dealing with them. Symptoms may include: ? Headaches. ? Nausea and vomiting. ? Vaginal discharge. ? Swelling. ? Fatigue. ? Constipation. ? Any discomfort, including back or pelvic pain. Make a list of questions to ask your health care provider at your routine visits. What tests might I have during prenatal care visits? You may have blood, urine, and imaging tests throughout your pregnancy, such as:  Urine tests to check for glucose, protein, or signs of infection.  Glucose tests to check for a form of diabetes that can develop during pregnancy (gestational diabetes mellitus). This is usually done around week 24 of pregnancy.  An ultrasound to check your baby's growth and development and to check for birth defects. This is usually done around week 20 of pregnancy.  A test to check for group B strep (GBS) infection. This is usually done around week 36 of pregnancy.  Genetic testing. This may include blood or imaging tests, such as an ultrasound. Some genetic tests are done during the first trimester and some are done during the second trimester. What else can I expect during prenatal care visits? Your health care provider may recommend getting certain vaccines during pregnancy. These may include:  A yearly flu shot (annual influenza vaccine). This is especially important if you will be pregnant during flu  season.  Tdap (tetanus, diphtheria, pertussis) vaccine. Getting this vaccine during pregnancy can protect your baby from whooping cough (pertussis) after birth. This vaccine may be recommended between weeks 27 and 36 of pregnancy. Later in your pregnancy, your health care provider may give you information about:  Childbirth and breastfeeding classes.  Choosing a health care provider for your baby.  Umbilical cord banking.  Breastfeeding.  Birth control after your baby is born.  The hospital labor and delivery unit and how to tour it.  Registering at the hospital before you go into labor. Where to find more information  Office on Women's Health: womenshealth.gov  American Pregnancy Association: americanpregnancy.org  March of Dimes: marchofdimes.org Summary  Prenatal care helps you and your baby stay as healthy as possible during pregnancy.  Your first prenatal care visit will most likely be the longest.  You will have visits and tests throughout your pregnancy to monitor your health and your baby's health.  Bring a list of questions to your visits to ask your health care provider.  Make sure to keep all follow-up and prenatal care visits with your health care provider. This information is not intended to replace advice given to you by your health care provider. Make sure you discuss any questions you have with your health care provider. Document Revised: 05/24/2018 Document Reviewed: 01/31/2017 Elsevier Patient Education  2020 Elsevier Inc.  

## 2019-05-26 LAB — URINE CULTURE

## 2019-05-28 LAB — CERVICOVAGINAL ANCILLARY ONLY
Chlamydia: NEGATIVE
Comment: NEGATIVE
Comment: NEGATIVE
Comment: NORMAL
Neisseria Gonorrhea: NEGATIVE
Trichomonas: POSITIVE — AB

## 2019-05-29 LAB — RPR+RH+ABO+RUB AB+AB SCR+CB...
Antibody Screen: NEGATIVE
HIV Screen 4th Generation wRfx: NONREACTIVE
Hematocrit: 34.8 % (ref 34.0–46.6)
Hemoglobin: 11.8 g/dL (ref 11.1–15.9)
Hepatitis B Surface Ag: NEGATIVE
MCH: 32.2 pg (ref 26.6–33.0)
MCHC: 33.9 g/dL (ref 31.5–35.7)
MCV: 95 fL (ref 79–97)
Platelets: 338 10*3/uL (ref 150–450)
RBC: 3.66 x10E6/uL — ABNORMAL LOW (ref 3.77–5.28)
RDW: 12.4 % (ref 11.7–15.4)
RPR Ser Ql: NONREACTIVE
Rh Factor: POSITIVE
Rubella Antibodies, IGG: 8.18 index (ref >0.99)
Varicella zoster IgG: 1192 index (ref >165)
WBC: 8.7 10*3/uL (ref 3.4–10.8)

## 2019-05-29 LAB — MATERNIT 21 PLUS CORE, BLOOD
Fetal Fraction: 10
Result (T21): NEGATIVE
Trisomy 13 (Patau syndrome): NEGATIVE
Trisomy 18 (Edwards syndrome): NEGATIVE
Trisomy 21 (Down syndrome): NEGATIVE

## 2019-05-29 LAB — HGB FRACTIONATION CASCADE
Hgb A2: 2.8 % (ref 1.8–3.2)
Hgb A: 97.2 % (ref 96.4–98.8)
Hgb F: 0 % (ref 0.0–2.0)
Hgb S: 0 %

## 2019-05-29 LAB — URINE DRUG PANEL 7
Amphetamines, Urine: NEGATIVE ng/mL
Barbiturate Quant, Ur: NEGATIVE ng/mL
Benzodiazepine Quant, Ur: NEGATIVE ng/mL
Cannabinoid Quant, Ur: POSITIVE — AB
Cocaine (Metab.): NEGATIVE ng/mL
Opiate Quant, Ur: NEGATIVE ng/mL
PCP Quant, Ur: NEGATIVE ng/mL

## 2019-05-29 NOTE — Telephone Encounter (Signed)
Can you send in a rx to her pharmacy?

## 2019-05-30 ENCOUNTER — Other Ambulatory Visit: Payer: Self-pay | Admitting: Advanced Practice Midwife

## 2019-05-30 DIAGNOSIS — A599 Trichomoniasis, unspecified: Secondary | ICD-10-CM

## 2019-05-30 MED ORDER — METRONIDAZOLE 500 MG PO TABS: 2000.0000 mg | ORAL_TABLET | Freq: Once | ORAL | 0 refills | Status: AC

## 2019-05-30 NOTE — Progress Notes (Unsigned)
Rx metronidazole sent to patient's pharmacy. Spoke with patient and reviewed all lab results and instructions given.

## 2019-06-21 ENCOUNTER — Encounter: Payer: Medicaid Other | Admitting: Advanced Practice Midwife

## 2019-06-28 ENCOUNTER — Other Ambulatory Visit: Payer: Self-pay

## 2019-06-28 ENCOUNTER — Ambulatory Visit (INDEPENDENT_AMBULATORY_CARE_PROVIDER_SITE_OTHER): Payer: Medicaid Other | Admitting: Advanced Practice Midwife

## 2019-06-28 ENCOUNTER — Encounter: Payer: Self-pay | Admitting: Advanced Practice Midwife

## 2019-06-28 VITALS — BP 110/70 | Wt 129.0 lb

## 2019-06-28 DIAGNOSIS — O09522 Supervision of elderly multigravida, second trimester: Secondary | ICD-10-CM

## 2019-06-28 DIAGNOSIS — R519 Headache, unspecified: Secondary | ICD-10-CM

## 2019-06-28 DIAGNOSIS — O26892 Other specified pregnancy related conditions, second trimester: Secondary | ICD-10-CM

## 2019-06-28 DIAGNOSIS — Z3A18 18 weeks gestation of pregnancy: Secondary | ICD-10-CM

## 2019-06-28 DIAGNOSIS — O0992 Supervision of high risk pregnancy, unspecified, second trimester: Secondary | ICD-10-CM

## 2019-06-28 LAB — POCT URINALYSIS DIPSTICK OB
Glucose, UA: NEGATIVE
POC,PROTEIN,UA: NEGATIVE

## 2019-06-28 MED ORDER — BUTALBITAL-APAP-CAFFEINE 50-325-40 MG PO CAPS: 1.0000 | ORAL_CAPSULE | Freq: Four times a day (QID) | ORAL | 2 refills | Status: DC | PRN

## 2019-06-28 NOTE — Progress Notes (Signed)
  Routine Prenatal Care Visit  Subjective  Olivia Sandoval is a 39 y.o. G3P1011 at [redacted]w[redacted]d being seen today for ongoing prenatal care.  She is currently monitored for the following issues for this high-risk pregnancy and has Supervision of high risk pregnancy in first trimester and Advanced maternal age in multigravida on their problem list.  ----------------------------------------------------------------------------------- Patient reports continued headaches 2 or 3 days per week. She admits staying hydrated. She does not think they are sinus related. They are mostly frontal tension type per her report. We discussed adequate sleep and hydration. She accepts Rx for Fioricet.    . Vag. Bleeding: None.  Movement: Present. Leaking Fluid denies.  ----------------------------------------------------------------------------------- The following portions of the patient's history were reviewed and updated as appropriate: allergies, current medications, past family history, past medical history, past social history, past surgical history and problem list. Problem list updated.  Objective  Blood pressure 110/70, weight 129 lb (58.5 kg), last menstrual period 02/18/2019. Pregravid weight 119 lb (54 kg) Total Weight Gain 10 lb (4.536 kg) Urinalysis: Urine Protein    Urine Glucose    Fetal Status: Fetal Heart Rate (bpm): 150   Movement: Present     General:  Alert, oriented and cooperative. Patient is in no acute distress.  Skin: Skin is warm and dry. No rash noted.   Cardiovascular: Normal heart rate noted  Respiratory: Normal respiratory effort, no problems with respiration noted  Abdomen: Soft, gravid, appropriate for gestational age.       Pelvic:  Cervical exam deferred        Extremities: Normal range of motion.  Edema: None  Mental Status: Normal mood and affect. Normal behavior. Normal judgment and thought content.   Assessment   39 y.o. G3P1011 at [redacted]w[redacted]d by  11/25/2019, by Last Menstrual Period  presenting for routine prenatal visit  Plan   pregnancy Problems (from 05/24/19 to present)    Problem Noted Resolved   Advanced maternal age in multigravida 05/24/2019 by Tresea Mall, CNM No    Headaches: Rx Fioricet, hydration, adequate sleep   Preterm labor symptoms and general obstetric precautions including but not limited to vaginal bleeding, contractions, leaking of fluid and fetal movement were reviewed in detail with the patient.   Return in about 2 weeks (around 07/12/2019) for anatomy scan and rob.  Tresea Mall, CNM 06/28/2019 4:06 PM

## 2019-06-28 NOTE — Progress Notes (Signed)
ROB- still having bad headaches

## 2019-06-28 NOTE — Addendum Note (Signed)
Addended by: Donnetta Hail on: 06/28/2019 04:28 PM   Modules accepted: Orders

## 2019-07-12 ENCOUNTER — Encounter: Payer: Medicaid Other | Admitting: Advanced Practice Midwife

## 2019-07-12 ENCOUNTER — Ambulatory Visit: Payer: Medicaid Other

## 2019-07-12 ENCOUNTER — Other Ambulatory Visit: Payer: Self-pay

## 2019-07-19 ENCOUNTER — Encounter: Payer: Self-pay | Admitting: Obstetrics & Gynecology

## 2019-07-19 ENCOUNTER — Ambulatory Visit (INDEPENDENT_AMBULATORY_CARE_PROVIDER_SITE_OTHER): Payer: Medicaid Other | Admitting: Obstetrics & Gynecology

## 2019-07-19 ENCOUNTER — Other Ambulatory Visit: Payer: Self-pay

## 2019-07-19 ENCOUNTER — Ambulatory Visit (INDEPENDENT_AMBULATORY_CARE_PROVIDER_SITE_OTHER): Payer: Medicaid Other

## 2019-07-19 VITALS — BP 120/80 | Wt 132.0 lb

## 2019-07-19 DIAGNOSIS — O0992 Supervision of high risk pregnancy, unspecified, second trimester: Secondary | ICD-10-CM

## 2019-07-19 DIAGNOSIS — O09522 Supervision of elderly multigravida, second trimester: Secondary | ICD-10-CM

## 2019-07-19 DIAGNOSIS — Z3A21 21 weeks gestation of pregnancy: Secondary | ICD-10-CM

## 2019-07-19 DIAGNOSIS — O0991 Supervision of high risk pregnancy, unspecified, first trimester: Secondary | ICD-10-CM

## 2019-07-19 NOTE — Patient Instructions (Signed)

## 2019-07-19 NOTE — Progress Notes (Signed)
  Subjective  Fetal Movement? yes Contractions? no Leaking Fluid? no Vaginal Bleeding? no  Objective  BP 120/80   Wt 132 lb (59.9 kg)   LMP 02/18/2019 (Within Days)   BMI 25.78 kg/m  General: NAD Pumonary: no increased work of breathing Abdomen: gravid, non-tender Extremities: no edema Psychiatric: mood appropriate, affect full  Review of ULTRASOUND. I have personally reviewed images and report of recent ultrasound done at [facility]. There is a singleton gestation with subjectively normal amniotic fluid volume. The fetal biometry correlates with established dating. Detailed evaluation of the fetal anatomy was performed.The fetal anatomical survey appears within normal limits within the resolution of ultrasound as described above.  It must be noted that a normal ultrasound is unable to rule out fetal aneuploidy.    Assessment  39 y.o. G3P1011 at [redacted]w[redacted]d by  11/25/2019, by Last Menstrual Period presenting for routine prenatal visit  Plan   Problem List Items Addressed This Visit      Other   Supervision of high risk pregnancy in first trimester   Advanced maternal age in multigravida    Other Visit Diagnoses    [redacted] weeks gestation of pregnancy    -  Primary   Supervision of high risk pregnancy in second trimester          pregnancy Problems (from 05/24/19 to present)    Problem Noted Resolved   Advanced maternal age in multigravida 05/24/2019 by Tresea Mall, CNM No       Annamarie Major, MD, [facility], [facility] 07/19/2019  3:27 PM

## 2019-08-16 ENCOUNTER — Ambulatory Visit (INDEPENDENT_AMBULATORY_CARE_PROVIDER_SITE_OTHER): Payer: Medicaid Other | Admitting: Obstetrics & Gynecology

## 2019-08-16 ENCOUNTER — Encounter: Payer: Self-pay | Admitting: Obstetrics & Gynecology

## 2019-08-16 ENCOUNTER — Other Ambulatory Visit: Payer: Self-pay

## 2019-08-16 VITALS — BP 100/60 | Wt 130.0 lb

## 2019-08-16 DIAGNOSIS — Z131 Encounter for screening for diabetes mellitus: Secondary | ICD-10-CM

## 2019-08-16 DIAGNOSIS — Z3A25 25 weeks gestation of pregnancy: Secondary | ICD-10-CM

## 2019-08-16 DIAGNOSIS — O0992 Supervision of high risk pregnancy, unspecified, second trimester: Secondary | ICD-10-CM

## 2019-08-16 DIAGNOSIS — O09522 Supervision of elderly multigravida, second trimester: Secondary | ICD-10-CM

## 2019-08-16 LAB — POCT URINALYSIS DIPSTICK OB
Glucose, UA: NEGATIVE
POC,PROTEIN,UA: NEGATIVE

## 2019-08-16 NOTE — Addendum Note (Signed)
Addended by: Cornelius Moras D on: 08/16/2019 03:34 PM   Modules accepted: Orders

## 2019-08-16 NOTE — Progress Notes (Signed)
  Subjective  Fetal Movement? yes Contractions? no Leaking Fluid? no Vaginal Bleeding? no  Objective  BP 100/60   Wt 130 lb (59 kg)   LMP 02/18/2019 (Within Days)   BMI 25.39 kg/m  General: NAD Pumonary: no increased work of breathing Abdomen: gravid, non-tender Extremities: no edema Psychiatric: mood appropriate, affect full  Assessment  39 y.o. G3P1011 at [redacted]w[redacted]d by  11/25/2019, by Last Menstrual Period presenting for routine prenatal visit  Plan   Problem List Items Addressed This Visit      Other   Advanced maternal age in multigravida    Other Visit Diagnoses    [redacted] weeks gestation of pregnancy    -  Primary   Supervision of high risk pregnancy in second trimester       Screening for diabetes mellitus       Relevant Orders   28 Week RH+Panel      pregnancy Problems (from 05/24/19 to present)    Problem Noted Resolved   Advanced maternal age in multigravida       PNV, Glc nv  Annamarie Major, MD, [facility], [facility] 08/16/2019  3:16 PM

## 2019-08-16 NOTE — Patient Instructions (Signed)
Pregnancy After Age 37 Women who become pregnant after the age of 37 have a higher risk for certain problems during pregnancy. This is because older women may already have health problems before becoming pregnant. Older women who are healthy before pregnancy may still develop problems during pregnancy. These problems may affect the mother, the unborn baby (fetus), or both. What are the risks for me? If you are over age 37 and you want to become pregnant or are pregnant, you may have a higher risk of:  Not being able to get pregnant (infertility).  Going into labor early (preterm labor).  Needing surgical delivery of your baby (cesarean delivery, or C-section).  Having high blood pressure (hypertension).  Having complications during pregnancy, such as high blood pressure and other symptoms (preeclampsia).  Having diabetes during pregnancy (gestational diabetes).  Being pregnant with more than one baby.  Loss of the unborn baby before 20 weeks (miscarriage) or after 20 weeks of pregnancy (stillbirth). What are the risks for my baby? Babies born to women over the age of 37 have a higher risk for:  Being born early (prematurity).  Low birth weight, which is less than 5 lb, 8 oz (2.5 kg).  Birth defects, such as Down syndrome and cleft palate.  Health complications, including problems with growth and development. How is prenatal care different for women over age 37? All women should see their health care provider before they try to become pregnant. This is especially important for women over the age of 37. Tell your health care provider about:  Any health problems you have.  Any medicines you take.  Any family history of health problems or chromosome-related defects.  Any problems you have had with past pregnancies or deliveries. If you are over age 37 and you plan to become pregnant:  Start taking a daily multivitamin a month or more before you try to get pregnant. Your  multivitamin should contain 400 mcg (micrograms) of folic acid. If you are over age 37 and pregnant, make sure you:  Keep taking your multivitamin unless your health care provider tells you not to take it.  Keep all prenatal visits as told by your health care provider. This is important.  Have ultrasounds regularly throughout your pregnancy to check for problems.  Talk with your health care provider about other prenatal screening tests that you may need. What additional prenatal tests are needed? Screening tests show whether your baby has a higher risk for birth defects than other babies. Screening tests include:  Ultrasound tests to look for markers that indicate a risk for birth defects.  Maternal blood screening. These are blood tests that measure certain substances in your blood to determine your baby's risk for defects. Screening tests do not show whether your baby has or does not have defects. They only show your baby's risk for certain defects. If your screening tests show that risk factors are present, you may need tests to confirm the defect (diagnostic testing). These tests may include:  Chorionic villus sampling. For this procedure, a tissue sample is taken from the organ that forms in your uterus to nourish your baby (placenta). The sample is removed through your cervix or abdomen and tested.  Amniocentesis. For this procedure, a small amount of the fluid that surrounds the baby in the uterus (amniotic fluid) is removed and tested. What can I do to stay healthy during my pregnancy? Staying healthy during pregnancy can help you and your baby to have a lower risk for   problems during pregnancy, during delivery, or both. Talk with your health care provider for specific instructions about staying healthy during your pregnancy. Nutrition   At each meal, eat a variety of foods from each of the five food groups. These groups include: ? Proteins such as lean meats, poultry, fish that is  low in fat, beans, eggs, and nuts. ? Vegetables such as leafy greens, raw and cooked vegetables, and vegetable juice. ? Fruits that are fresh, frozen, or canned, or 100% fruit juice. ? Dairy products such as low-fat yogurt, cheese, and milk. ? Whole grains including rice, cereal, pasta, and bread.  Talk with your health care provider about how much food in each group is right for you.  Follow instructions from your health care provider about eating and drinking restrictions during pregnancy. ? Do not eat raw eggs, raw meat, or raw fish or seafood. ? Do not eat any fish that contains high amounts of mercury, such as swordfish or mackerel.  Drink 6-8 or more glasses of water a day. You should drink enough fluid to keep your urine pale yellow. Managing weight gain  Ask your health care provider how much weight gain is healthy during pregnancy.  Stay at a healthy weight. If needed, work with your health care provider to lose weight safely. Activity  Exercise regularly, as directed by your health care provider. Ask your health care provider what forms of exercise are safe for you. General instructions  Do not use any products that contain nicotine or tobacco, such as cigarettes and e-cigarettes. If you need help quitting, ask your health care provider.  Do not drink alcohol, use drugs, or abuse prescription medicine.  Take over-the-counter and prescription medicines only as told by your health care provider.  Do not use hot tubs, steam rooms, or saunas.  Talk with your health care provider about your risk of exposure to harmful environmental conditions. This includes exposure to chemicals, radiation, cleaning products, and cat feces. Follow advice from your health care provider about how to limit your exposure. Summary  Women who become pregnant after the age of 37 have a higher risk for complications during pregnancy.  Problems may affect the mother, the unborn baby (fetus), or  both.  All women should see their health care provider before they try to become pregnant. This is especially important for women over the age of 37.  Staying healthy during pregnancy can help both you and your baby to have a lower risk for some of the problems that can happen during pregnancy, during delivery, or both. This information is not intended to replace advice given to you by your health care provider. Make sure you discuss any questions you have with your health care provider. Document Revised: 05/26/2018 Document Reviewed: 05/24/2016 Elsevier Patient Education  2020 [employer]  

## 2019-09-06 ENCOUNTER — Encounter: Payer: Medicaid Other | Admitting: Advanced Practice Midwife

## 2019-09-06 ENCOUNTER — Other Ambulatory Visit: Payer: Medicaid Other

## 2019-09-14 ENCOUNTER — Ambulatory Visit (INDEPENDENT_AMBULATORY_CARE_PROVIDER_SITE_OTHER): Payer: Medicaid Other | Admitting: Obstetrics

## 2019-09-14 ENCOUNTER — Other Ambulatory Visit: Payer: Self-pay

## 2019-09-14 ENCOUNTER — Other Ambulatory Visit: Payer: Medicaid Other

## 2019-09-14 VITALS — BP 110/60 | Wt 134.0 lb

## 2019-09-14 DIAGNOSIS — Z3A29 29 weeks gestation of pregnancy: Secondary | ICD-10-CM

## 2019-09-14 DIAGNOSIS — Z23 Encounter for immunization: Secondary | ICD-10-CM | POA: Diagnosis not present

## 2019-09-14 DIAGNOSIS — Z131 Encounter for screening for diabetes mellitus: Secondary | ICD-10-CM

## 2019-09-14 DIAGNOSIS — O0993 Supervision of high risk pregnancy, unspecified, third trimester: Secondary | ICD-10-CM

## 2019-09-14 DIAGNOSIS — O0991 Supervision of high risk pregnancy, unspecified, first trimester: Secondary | ICD-10-CM

## 2019-09-14 LAB — POCT URINALYSIS DIPSTICK OB
Glucose, UA: NEGATIVE
POC,PROTEIN,UA: NEGATIVE

## 2019-09-14 NOTE — Progress Notes (Signed)
ROB/1 hr GTT/TDAP and BT consent- no concerns

## 2019-09-14 NOTE — Progress Notes (Signed)
  Routine Prenatal Care Visit  Subjective  Olivia Sandoval is a 39 y.o. G3P1011 at [redacted]w[redacted]d being seen today for ongoing prenatal care.  She is currently monitored for the following issues for this high-risk pregnancy and has Supervision of high risk pregnancy in first trimester and Advanced maternal age in multigravida on their problem list.  ----------------------------------------------------------------------------------- Patient reports no complaints.    .  .   Pincus Large Fluid denies.  ----------------------------------------------------------------------------------- The following portions of the patient's history were reviewed and updated as appropriate: allergies, current medications, past family history, past medical history, past social history, past surgical history and problem list. Problem list updated.  Objective  Blood pressure (!) 110/60, weight 134 lb (60.8 kg), last menstrual period 02/18/2019. Pregravid weight 119 lb (54 kg) Total Weight Gain 15 lb (6.804 kg) Urinalysis: Urine Protein Negative  Urine Glucose Negative  Fetal Status:           General:  Alert, oriented and cooperative. Patient is in no acute distress.  Skin: Skin is warm and dry. No rash noted.   Cardiovascular: Normal heart rate noted  Respiratory: Normal respiratory effort, no problems with respiration noted  Abdomen: Soft, gravid, appropriate for gestational age.       Pelvic:  Cervical exam deferred        Extremities: Normal range of motion.     Mental Status: Normal mood and affect. Normal behavior. Normal judgment and thought content.   Assessment   39 y.o. G3P1011 at [redacted]w[redacted]d by  11/25/2019, by Last Menstrual Period presenting for routine prenatal visit and her 28 week labs. For tdap  Plan   pregnancy Problems (from 05/24/19 to present)    Problem Noted Resolved   Advanced maternal age in multigravida 05/24/2019 by Tresea Mall, CNM No       Preterm labor symptoms and general obstetric  precautions including but not limited to vaginal bleeding, contractions, leaking of fluid and fetal movement were reviewed in detail with the patient. Please refer to After Visit Summary for other counseling recommendations.  tdap given.  Return in about 2 weeks (around 09/28/2019) for return OB.  Mirna Mires, CNM  09/14/2019 4:08 PM

## 2019-09-15 LAB — 28 WEEK RH+PANEL
Basophils Absolute: 0 10*3/uL (ref 0.0–0.2)
Basos: 0 %
EOS (ABSOLUTE): 0.1 10*3/uL (ref 0.0–0.4)
Eos: 1 %
Gestational Diabetes Screen: 77 mg/dL (ref 65–139)
HIV Screen 4th Generation wRfx: NONREACTIVE
Hematocrit: 27.9 % — ABNORMAL LOW (ref 34.0–46.6)
Hemoglobin: 9.5 g/dL — ABNORMAL LOW (ref 11.1–15.9)
Immature Grans (Abs): 0.1 10*3/uL (ref 0.0–0.1)
Immature Granulocytes: 1 %
Lymphocytes Absolute: 1.9 10*3/uL (ref 0.7–3.1)
Lymphs: 22 %
MCH: 33.1 pg — ABNORMAL HIGH (ref 26.6–33.0)
MCHC: 34.1 g/dL (ref 31.5–35.7)
MCV: 97 fL (ref 79–97)
Monocytes Absolute: 0.6 10*3/uL (ref 0.1–0.9)
Monocytes: 7 %
Neutrophils Absolute: 6.1 10*3/uL (ref 1.4–7.0)
Neutrophils: 69 %
Platelets: 337 10*3/uL (ref 150–450)
RBC: 2.87 x10E6/uL — ABNORMAL LOW (ref 3.77–5.28)
RDW: 12.1 % (ref 11.7–15.4)
RPR Ser Ql: NONREACTIVE
WBC: 8.8 10*3/uL (ref 3.4–10.8)

## 2019-09-27 ENCOUNTER — Other Ambulatory Visit: Payer: Self-pay

## 2019-09-27 ENCOUNTER — Ambulatory Visit (INDEPENDENT_AMBULATORY_CARE_PROVIDER_SITE_OTHER): Payer: Medicaid Other | Admitting: Advanced Practice Midwife

## 2019-09-27 ENCOUNTER — Encounter: Payer: Self-pay | Admitting: Advanced Practice Midwife

## 2019-09-27 VITALS — BP 118/74 | Wt 136.0 lb

## 2019-09-27 DIAGNOSIS — O09523 Supervision of elderly multigravida, third trimester: Secondary | ICD-10-CM

## 2019-09-27 DIAGNOSIS — O0993 Supervision of high risk pregnancy, unspecified, third trimester: Secondary | ICD-10-CM

## 2019-09-27 DIAGNOSIS — Z3A31 31 weeks gestation of pregnancy: Secondary | ICD-10-CM

## 2019-09-27 DIAGNOSIS — O0991 Supervision of high risk pregnancy, unspecified, first trimester: Secondary | ICD-10-CM

## 2019-09-27 NOTE — Progress Notes (Signed)
  Routine Prenatal Care Visit  Subjective  Olivia Sandoval is a 39 y.o. G3P1011 at [redacted]w[redacted]d being seen today for ongoing prenatal care.  She is currently monitored for the following issues for this high-risk pregnancy and has Supervision of high risk pregnancy in first trimester and Advanced maternal age in multigravida on their problem list.  ----------------------------------------------------------------------------------- Patient reports no complaints. She is staying hydrated. Her appetite is not great. Encouraged her to eat 6 small meals instead of 3 big meals. We discussed doing weekly AFI and NST beginning at 36 weeks due to AMA status. Contractions: Not present. Vag. Bleeding: None.  Movement: Present. Leaking Fluid denies.  ----------------------------------------------------------------------------------- The following portions of the patient's history were reviewed and updated as appropriate: allergies, current medications, past family history, past medical history, past social history, past surgical history and problem list. Problem list updated.  Objective  Blood pressure 118/74, weight 136 lb (61.7 kg), last menstrual period 02/18/2019. Pregravid weight 119 lb (54 kg) Total Weight Gain 17 lb (7.711 kg) Urinalysis: Urine Protein    Urine Glucose    Fetal Status: Fetal Heart Rate (bpm): 145 Fundal Height: 32 cm Movement: Present     General:  Alert, oriented and cooperative. Patient is in no acute distress.  Skin: Skin is warm and dry. No rash noted.   Cardiovascular: Normal heart rate noted  Respiratory: Normal respiratory effort, no problems with respiration noted  Abdomen: Soft, gravid, appropriate for gestational age. Pain/Pressure: Absent     Pelvic:  Cervical exam deferred        Extremities: Normal range of motion.  Edema: None  Mental Status: Normal mood and affect. Normal behavior. Normal judgment and thought content.   Assessment   39 y.o. G3P1011 at [redacted]w[redacted]d by   11/25/2019, by Last Menstrual Period presenting for routine prenatal visit  Plan   pregnancy Problems (from 05/24/19 to present)    Problem Noted Resolved   Advanced maternal age in multigravida 05/24/2019 by Tresea Mall, CNM No       Preterm labor symptoms and general obstetric precautions including but not limited to vaginal bleeding, contractions, leaking of fluid and fetal movement were reviewed in detail with the patient.  Weekly AFI/NST beginning at 36 weeks   Return in about 2 weeks (around 10/11/2019) for rob.  Tresea Mall, CNM 09/27/2019 2:23 PM

## 2019-09-27 NOTE — Progress Notes (Signed)
No vb. No lof.  

## 2019-10-11 ENCOUNTER — Encounter: Payer: Medicaid Other | Admitting: Obstetrics

## 2019-11-12 ENCOUNTER — Other Ambulatory Visit: Payer: Self-pay

## 2019-11-12 ENCOUNTER — Ambulatory Visit (INDEPENDENT_AMBULATORY_CARE_PROVIDER_SITE_OTHER): Payer: Medicaid Other | Admitting: Advanced Practice Midwife

## 2019-11-12 ENCOUNTER — Other Ambulatory Visit (HOSPITAL_COMMUNITY)
Admission: RE | Admit: 2019-11-12 | Discharge: 2019-11-12 | Disposition: A | Discharge status: Home / Self Care | Payer: Medicaid Other | Source: Ambulatory Visit | Attending: Obstetrics | Admitting: Obstetrics

## 2019-11-12 ENCOUNTER — Encounter: Payer: Self-pay | Admitting: Advanced Practice Midwife

## 2019-11-12 VITALS — BP 118/70 | Ht 60.0 in | Wt 138.2 lb

## 2019-11-12 DIAGNOSIS — Z3A38 38 weeks gestation of pregnancy: Secondary | ICD-10-CM

## 2019-11-12 DIAGNOSIS — O09523 Supervision of elderly multigravida, third trimester: Secondary | ICD-10-CM

## 2019-11-12 DIAGNOSIS — O0991 Supervision of high risk pregnancy, unspecified, first trimester: Secondary | ICD-10-CM

## 2019-11-12 DIAGNOSIS — Z3685 Encounter for antenatal screening for Streptococcus B: Secondary | ICD-10-CM

## 2019-11-12 DIAGNOSIS — Z113 Encounter for screening for infections with a predominantly sexual mode of transmission: Secondary | ICD-10-CM

## 2019-11-12 LAB — FETAL NONSTRESS TEST

## 2019-11-12 NOTE — Progress Notes (Signed)
PT here for OB and NST today.

## 2019-11-12 NOTE — Progress Notes (Signed)
  Routine Prenatal Care Visit  Subjective  Olivia Sandoval is a 39 y.o. G3P1011 at [redacted]w[redacted]d being seen today for ongoing prenatal care.  She is currently monitored for the following issues for this high-risk pregnancy and has Supervision of high risk pregnancy in first trimester and Advanced maternal age in multigravida on their problem list.  ----------------------------------------------------------------------------------- Patient reports no complaints.  We discussed next visit with AFI and NST. Contractions: Not present. Vag. Bleeding: None.  Movement: Present. Leaking Fluid denies.  ----------------------------------------------------------------------------------- The following portions of the patient's history were reviewed and updated as appropriate: allergies, current medications, past family history, past medical history, past social history, past surgical history and problem list. Problem list updated.  Objective  Blood pressure 118/70, height 5' (1.524 m), weight 138 lb 3.2 oz (62.7 kg), last menstrual period 02/18/2019. Pregravid weight 119 lb (54 kg) Total Weight Gain 19 lb 3.2 oz (8.709 kg) Urinalysis: Urine Protein    Urine Glucose    Fetal Status: Fetal Heart Rate (bpm): 135 Fundal Height: 38 cm Movement: Present      NST: reactive 30 minute tracing, 135 bpm, moderate variability, +accelerations, -decelerations  General:  Alert, oriented and cooperative. Patient is in no acute distress.  Skin: Skin is warm and dry. No rash noted.   Cardiovascular: Normal heart rate noted  Respiratory: Normal respiratory effort, no problems with respiration noted  Abdomen: Soft, gravid, appropriate for gestational age. Pain/Pressure: Absent     Pelvic:  GBS/Aptima collected        Extremities: Normal range of motion.  Edema: None  Mental Status: Normal mood and affect. Normal behavior. Normal judgment and thought content.   Assessment   39 y.o. G3P1011 at [redacted]w[redacted]d by  11/25/2019, by Last  Menstrual Period presenting for routine prenatal visit  Plan   pregnancy Problems (from 05/24/19 to present)    Problem Noted Resolved   Advanced maternal age in multigravida 05/24/2019 by Tresea Mall, CNM No       Term labor symptoms and general obstetric precautions including but not limited to vaginal bleeding, contractions, leaking of fluid and fetal movement were reviewed in detail with the patient.   Return in about 1 week (around 11/19/2019) for afi/nst/rob.  Tresea Mall, CNM 11/12/2019 9:21 AM

## 2019-11-13 LAB — CERVICOVAGINAL ANCILLARY ONLY
Chlamydia: NEGATIVE
Comment: NEGATIVE
Comment: NEGATIVE
Comment: NORMAL
Neisseria Gonorrhea: NEGATIVE
Trichomonas: NEGATIVE

## 2019-11-17 LAB — STREP GP B CULTURE+RFLX: Strep Gp B Culture+Rflx: NEGATIVE

## 2019-11-21 ENCOUNTER — Other Ambulatory Visit: Payer: Self-pay

## 2019-11-21 ENCOUNTER — Ambulatory Visit (INDEPENDENT_AMBULATORY_CARE_PROVIDER_SITE_OTHER): Payer: Medicaid Other | Admitting: Advanced Practice Midwife

## 2019-11-21 ENCOUNTER — Ambulatory Visit (INDEPENDENT_AMBULATORY_CARE_PROVIDER_SITE_OTHER): Payer: Medicaid Other

## 2019-11-21 ENCOUNTER — Encounter: Payer: Self-pay | Admitting: Advanced Practice Midwife

## 2019-11-21 VITALS — BP 122/70 | Wt 133.0 lb

## 2019-11-21 DIAGNOSIS — O09523 Supervision of elderly multigravida, third trimester: Secondary | ICD-10-CM

## 2019-11-21 DIAGNOSIS — O0991 Supervision of high risk pregnancy, unspecified, first trimester: Secondary | ICD-10-CM

## 2019-11-21 DIAGNOSIS — Z3A39 39 weeks gestation of pregnancy: Secondary | ICD-10-CM

## 2019-11-21 LAB — FETAL NONSTRESS TEST

## 2019-11-21 NOTE — Progress Notes (Addendum)
  Routine Prenatal Care Visit  Subjective  Olivia Sandoval is a 39 y.o. G3P1011 at [redacted]w[redacted]d being seen today for ongoing prenatal care.  She is currently monitored for the following issues for this high-risk pregnancy and has Supervision of high risk pregnancy in first trimester and Advanced maternal age in multigravida on their problem list.  ----------------------------------------------------------------------------------- Patient reports no complaints.  We discussed IOL vs spontaneous labor. She prefers ROB in 1 week and then 41w IOL. Contractions: Not present. Vag. Bleeding: None.  Movement: Present. Leaking Fluid denies.  ----------------------------------------------------------------------------------- The following portions of the patient's history were reviewed and updated as appropriate: allergies, current medications, past family history, past medical history, past social history, past surgical history and problem list. Problem list updated.  Objective  Blood pressure 122/70, weight 133 lb (60.3 kg), last menstrual period 02/18/2019. Pregravid weight 119 lb (54 kg) Total Weight Gain 14 lb (6.35 kg) Urinalysis: Urine Protein    Urine Glucose    Fetal Status: Fetal Heart Rate (bpm): 140   Movement: Present  Presentation: Vertex   AFI: 19.2 cm NST: reactive 20 minute tracing, 140 bpm, moderate variability, +accelerations, -decelerations  General:  Alert, oriented and cooperative. Patient is in no acute distress.  Skin: Skin is warm and dry. No rash noted.   Cardiovascular: Normal heart rate noted  Respiratory: Normal respiratory effort, no problems with respiration noted  Abdomen: Soft, gravid, appropriate for gestational age. Pain/Pressure: Absent     Pelvic:  Cervical exam performed Dilation: 3 Effacement (%): 50 Station: -2, cervical sweep  Extremities: Normal range of motion.  Edema: None  Mental Status: Normal mood and affect. Normal behavior. Normal judgment and thought  content.   Assessment   39 y.o. G3P1011 at [redacted]w[redacted]d by  11/25/2019, by Last Menstrual Period presenting for routine prenatal visit  Plan   pregnancy Problems (from 05/24/19 to present)    Problem Noted Resolved   Advanced maternal age in multigravida 05/24/2019 by Tresea Mall, CNM No       Term labor symptoms and general obstetric precautions including but not limited to vaginal bleeding, contractions, leaking of fluid and fetal movement were reviewed in detail with the patient. Please refer to After Visit Summary for other counseling recommendations.   Return in about 1 week (around 11/28/2019) for afi/nst/rob.  Tresea Mall, CNM 11/21/2019 10:47 AM

## 2019-11-21 NOTE — Patient Instructions (Signed)
Labor Induction  Labor induction is when steps are taken to cause a pregnant woman to begin the labor process. Most women go into labor on their own between 37 weeks and 42 weeks of pregnancy. When this does not happen or when there is a medical need for labor to begin, steps may be taken to induce labor. Labor induction causes a pregnant woman's uterus to contract. It also causes the cervix to soften (ripen), open (dilate), and thin out (efface). Usually, labor is not induced before 39 weeks of pregnancy unless there is a medical reason to do so. Your health care provider will determine if labor induction is needed. Before inducing labor, your health care provider will consider a number of factors, including:  Your medical condition and your baby's.  How many weeks along you are in your pregnancy.  How mature your baby's lungs are.  The condition of your cervix.  The position of your baby.  The size of your birth canal. What are some reasons for labor induction? Labor may be induced if:  Your health or your baby's health is at risk.  Your pregnancy is overdue by 1 week or more.  Your water breaks but labor does not start on its own.  There is a low amount of amniotic fluid around your baby. You may also choose (elect) to have labor induced at a certain time. Generally, elective labor induction is done no earlier than 39 weeks of pregnancy. What methods are used for labor induction? Methods used for labor induction include:  Prostaglandin medicine. This medicine starts contractions and causes the cervix to dilate and ripen. It can be taken by mouth (orally) or by being inserted into the vagina (suppository).  Inserting a small, thin tube (catheter) with a balloon into the vagina and then expanding the balloon with water to dilate the cervix.  Stripping the membranes. In this method, your health care provider gently separates amniotic sac tissue from the cervix. This causes the  cervix to stretch, which in turn causes the release of a hormone called progesterone. The hormone causes the uterus to contract. This procedure is often done during an office visit, after which you will be sent home to wait for contractions to begin.  Breaking the water. In this method, your health care provider uses a small instrument to make a small hole in the amniotic sac. This eventually causes the amniotic sac to break. Contractions should begin after a few hours.  Medicine to trigger or strengthen contractions. This medicine is given through an IV that is inserted into a vein in your arm. Except for membrane stripping, which can be done in a clinic, labor induction is done in the hospital so that you and your baby can be carefully monitored. How long does it take for labor to be induced? The length of time it takes to induce labor depends on how ready your body is for labor. Some inductions can take up to 2-3 days, while others may take less than a day. Induction may take longer if:  You are induced early in your pregnancy.  It is your first pregnancy.  Your cervix is not ready. What are some risks associated with labor induction? Some risks associated with labor induction include:  Changes in fetal heart rate, such as being too high, too low, or irregular (erratic).  Failed induction.  Infection in the mother or the baby.  Increased risk of having a cesarean delivery.  Fetal death.  Breaking off (abruption)   of the placenta from the uterus (rare).  Rupture of the uterus (very rare). When induction is needed for medical reasons, the benefits of induction generally outweigh the risks. What are some reasons for not inducing labor? Labor induction should not be done if:  Your baby does not tolerate contractions.  You have had previous surgeries on your uterus, such as a myomectomy, removal of fibroids, or a vertical scar from a previous cesarean delivery.  Your placenta lies  very low in your uterus and blocks the opening of the cervix (placenta previa).  Your baby is not in a head-down position.  The umbilical cord drops down into the birth canal in front of the baby.  There are unusual circumstances, such as the baby being very early (premature).  You have had more than 2 previous cesarean deliveries. Summary  Labor induction is when steps are taken to cause a pregnant woman to begin the labor process.  Labor induction causes a pregnant woman's uterus to contract. It also causes the cervix to ripen, dilate, and efface.  Labor is not induced before 39 weeks of pregnancy unless there is a medical reason to do so.  When induction is needed for medical reasons, the benefits of induction generally outweigh the risks. This information is not intended to replace advice given to you by your health care provider. Make sure you discuss any questions you have with your health care provider. Document Revised: 02/04/2017 Document Reviewed: 03/17/2016 Elsevier Patient Education  2020 [employer] Inc[employer] Pain Relief During Labor and Delivery Many things can cause pain during labor and delivery, including:  Pressure on bones and ligaments due to the baby moving through the pelvis.  Stretching of tissues due to the baby moving through the birth canal.  Muscle tension due to anxiety or nervousness.  The uterus tightening (contracting) and relaxing to help move the baby. There are many ways to deal with the pain of labor and delivery. They include:  Taking prenatal classes. Taking these classes helps you know what to expect during your baby's birth. What you learn will increase your confidence and decrease your anxiety.  Practicing relaxation techniques or doing relaxing activities, such as: ? Focused breathing. ? Meditation. ? Visualization. ? Aroma therapy. ? Listening to your favorite music. ? Hypnosis.  Taking a warm shower or bath (hydrotherapy). This  may: ? Provide comfort and relaxation. ? Lessen your perception of pain. ? Decrease the amount of pain medicine needed. ? Decrease the length of labor.  Getting a massage or counterpressure on your back.  Applying warm packs or ice packs.  Changing positions often, moving around, or using a birthing ball.  Getting: ? Pain medicine through an IV or injection into a muscle. ? Pain medicine inserted into your spinal column. ? Injections of sterile water just under the skin on your lower back (intradermal injections). ? Laughing gas (nitrous oxide). Discuss your pain control options with your health care provider during your prenatal visits. Explore the options offered by your hospital or birth center. What kinds of medicine are available? There are two kinds of medicines that can be used to relieve pain during labor and delivery:  Analgesics. These medicines decrease pain without causing you to lose feeling or the ability to move your muscles.  Anesthetics. These medicines block feeling in the body and can decrease your ability to move freely. Both of these kinds of medicine can cause minor side effects, such as nausea, trouble concentrating, and sleepiness. They can also  decrease the baby's heart rate before birth and affect the baby's breathing rate after birth. For this reason, health care providers are careful about when and how much medicine is given. What are specific medicines and procedures that provide pain relief? Local Anesthetics Local anesthetics are used to numb a small area of the body. They may be used along with another kind of anesthetic or used to numb the nerves of the vagina, cervix, and perineum during the second stage of labor. General Anesthetics General anesthetics cause you to lose consciousness so you do not feel pain. They are usually only used for an emergency cesarean delivery. General anesthetics are given through an IV tube and a mask. Pudendal Block A  pudendal block is a form of local anesthetic. It may be used to relieve the pain associated with pushing or stretching of the perineum at the time of delivery or to further numb the perineum. A pudendal block is done by injecting numbing medicine through the vaginal wall into a nerve in the pelvis. Epidural Analgesia Epidural analgesia is given through a flexible IV catheter that is inserted into the lower back. Numbing medicine is delivered continuously to the area near your spinal column nerves (epidural space). After having this type of analgesia, you may be able to move your legs but you most likely will not be able to walk. Depending on the amount of medicine given, you may lose all feeling in the lower half of your body, or you may retain some level of sensation, including the urge to push. Epidural analgesia can be used to provide pain relief for a vaginal birth. Spinal Block A spinal block is similar to epidural analgesia, but the medicine is injected into the spinal fluid instead of the epidural space. A spinal block is only given once. It starts to relieve pain quickly, but the pain relief lasts only 1-6 hours. Spinal blocks can be used for cesarean deliveries. Combined Spinal-Epidural (CSE) Block A CSE block combines the effects of a spinal block and epidural analgesia. The spinal block works quickly to block all pain. The epidural analgesia provides continuous pain relief, even after the effects of the spinal block have worn off. This information is not intended to replace advice given to you by your health care provider. Make sure you discuss any questions you have with your health care provider. Document Revised: 01/14/2017 Document Reviewed: 06/25/2015 Elsevier Patient Education  2020 Elsevier Inc. Vaginal Delivery  Vaginal delivery means that you give birth by pushing your baby out of your birth canal (vagina). A team of health care providers will help you before, during, and after  vaginal delivery. Birth experiences are unique for every woman and every pregnancy, and birth experiences vary depending on where you choose to give birth. What happens when I arrive at the birth center or hospital? Once you are in labor and have been admitted into the hospital or birth center, your health care provider may:  Review your pregnancy history and any concerns that you have.  Insert an IV into one of your veins. This may be used to give you fluids and medicines.  Check your blood pressure, pulse, temperature, and heart rate (vital signs).  Check whether your bag of water (amniotic sac) has broken (ruptured).  Talk with you about your birth plan and discuss pain control options. Monitoring Your health care provider may monitor your contractions (uterine monitoring) and your baby's heart rate (fetal monitoring). You may need to be monitored:  Often,  but not continuously (intermittently).  All the time or for long periods at a time (continuously). Continuous monitoring may be needed if: ? You are taking certain medicines, such as medicine to relieve pain or make your contractions stronger. ? You have pregnancy or labor complications. Monitoring may be done by:  Placing a special stethoscope or a handheld monitoring device on your abdomen to check your baby's heartbeat and to check for contractions.  Placing monitors on your abdomen (external monitors) to record your baby's heartbeat and the frequency and length of contractions.  Placing monitors inside your uterus through your vagina (internal monitors) to record your baby's heartbeat and the frequency, length, and strength of your contractions. Depending on the type of monitor, it may remain in your uterus or on your baby's head until birth.  Telemetry. This is a type of continuous monitoring that can be done with external or internal monitors. Instead of having to stay in bed, you are able to move around during  telemetry. Physical exam Your health care provider may perform frequent physical exams. This may include:  Checking how and where your baby is positioned in your uterus.  Checking your cervix to determine: ? Whether it is thinning out (effacing). ? Whether it is opening up (dilating). What happens during labor and delivery?  Normal labor and delivery is divided into the following three stages: Stage 1  This is the longest stage of labor.  This stage can last for hours or days.  Throughout this stage, you will feel contractions. Contractions generally feel mild, infrequent, and irregular at first. They get stronger, more frequent (about every 2-3 minutes), and more regular as you move through this stage.  This stage ends when your cervix is completely dilated to 4 inches (10 cm) and completely effaced. Stage 2  This stage starts once your cervix is completely effaced and dilated and lasts until the delivery of your baby.  This stage may last from 20 minutes to 2 hours.  This is the stage where you will feel an urge to push your baby out of your vagina.  You may feel stretching and burning pain, especially when the widest part of your baby's head passes through the vaginal opening (crowning).  Once your baby is delivered, the umbilical cord will be clamped and cut. This usually occurs after waiting a period of 1-2 minutes after delivery.  Your baby will be placed on your bare chest (skin-to-skin contact) in an upright position and covered with a warm blanket. Watch your baby for feeding cues, like rooting or sucking, and help the baby to your breast for his or her first feeding. Stage 3  This stage starts immediately after the birth of your baby and ends after you deliver the placenta.  This stage may take anywhere from 5 to 30 minutes.  After your baby has been delivered, you will feel contractions as your body expels the placenta and your uterus contracts to control  bleeding. What can I expect after labor and delivery?  After labor is over, you and your baby will be monitored closely until you are ready to go home to ensure that you are both healthy. Your health care team will teach you how to care for yourself and your baby.  You and your baby will stay in the same room (rooming in) during your hospital stay. This will encourage early bonding and successful breastfeeding.  You may continue to receive fluids and medicines through an IV.  Your uterus  will be checked and massaged regularly (fundal massage).  You will have some soreness and pain in your abdomen, vagina, and the area of skin between your vaginal opening and your anus (perineum).  If an incision was made near your vagina (episiotomy) or if you had some vaginal tearing during delivery, cold compresses may be placed on your episiotomy or your tear. This helps to reduce pain and swelling.  You may be given a squirt bottle to use instead of wiping when you go to the bathroom. To use the squirt bottle, follow these steps: ? Before you urinate, fill the squirt bottle with warm water. Do not use hot water. ? After you urinate, while you are sitting on the toilet, use the squirt bottle to rinse the area around your urethra and vaginal opening. This rinses away any urine and blood. ? Fill the squirt bottle with clean water every time you use the bathroom.  It is normal to have vaginal bleeding after delivery. Wear a sanitary pad for vaginal bleeding and discharge. Summary  Vaginal delivery means that you will give birth by pushing your baby out of your birth canal (vagina).  Your health care provider may monitor your contractions (uterine monitoring) and your baby's heart rate (fetal monitoring).  Your health care provider may perform a physical exam.  Normal labor and delivery is divided into three stages.  After labor is over, you and your baby will be monitored closely until you are ready to  go home. This information is not intended to replace advice given to you by your health care provider. Make sure you discuss any questions you have with your health care provider. Document Revised: 03/08/2017 Document Reviewed: 03/08/2017 Elsevier Patient Education  2020 [employer]

## 2019-11-21 NOTE — Progress Notes (Signed)
No vb. No lof. U/s and NST today

## 2019-11-25 ENCOUNTER — Encounter (HOSPITAL_COMMUNITY): Payer: Self-pay | Admitting: Obstetrics and Gynecology

## 2019-11-25 ENCOUNTER — Other Ambulatory Visit: Payer: Self-pay

## 2019-11-25 ENCOUNTER — Inpatient Hospital Stay (HOSPITAL_COMMUNITY): Payer: Medicaid Other | Admitting: Anesthesiology

## 2019-11-25 ENCOUNTER — Inpatient Hospital Stay (HOSPITAL_COMMUNITY)
Admission: AD | Admit: 2019-11-25 | Discharge: 2019-11-27 | DRG: 807 | Disposition: A | Discharge status: Home / Self Care | Payer: Medicaid Other | Attending: Obstetrics & Gynecology | Admitting: Obstetrics & Gynecology

## 2019-11-25 DIAGNOSIS — Z880 Allergy status to penicillin: Secondary | ICD-10-CM | POA: Diagnosis not present

## 2019-11-25 DIAGNOSIS — F17210 Nicotine dependence, cigarettes, uncomplicated: Secondary | ICD-10-CM | POA: Diagnosis present

## 2019-11-25 DIAGNOSIS — O99334 Smoking (tobacco) complicating childbirth: Principal | ICD-10-CM | POA: Diagnosis present

## 2019-11-25 DIAGNOSIS — O09529 Supervision of elderly multigravida, unspecified trimester: Secondary | ICD-10-CM

## 2019-11-25 DIAGNOSIS — O26893 Other specified pregnancy related conditions, third trimester: Secondary | ICD-10-CM | POA: Diagnosis present

## 2019-11-25 DIAGNOSIS — Z3A40 40 weeks gestation of pregnancy: Secondary | ICD-10-CM

## 2019-11-25 DIAGNOSIS — O480 Post-term pregnancy: Secondary | ICD-10-CM

## 2019-11-25 DIAGNOSIS — Z3490 Encounter for supervision of normal pregnancy, unspecified, unspecified trimester: Secondary | ICD-10-CM

## 2019-11-25 DIAGNOSIS — O99324 Drug use complicating childbirth: Secondary | ICD-10-CM | POA: Diagnosis present

## 2019-11-25 DIAGNOSIS — Z20822 Contact with and (suspected) exposure to covid-19: Secondary | ICD-10-CM | POA: Diagnosis present

## 2019-11-25 DIAGNOSIS — Z23 Encounter for immunization: Secondary | ICD-10-CM | POA: Diagnosis not present

## 2019-11-25 DIAGNOSIS — F1290 Cannabis use, unspecified, uncomplicated: Secondary | ICD-10-CM | POA: Diagnosis present

## 2019-11-25 LAB — CBC
HCT: 37.6 % (ref 36.0–46.0)
Hemoglobin: 11.2 g/dL — ABNORMAL LOW (ref 12.0–15.0)
MCH: 32.5 pg (ref 26.0–34.0)
MCHC: 29.8 g/dL — ABNORMAL LOW (ref 30.0–36.0)
MCV: 109 fL — ABNORMAL HIGH (ref 80.0–100.0)
Platelets: 317 10*3/uL (ref 150–400)
RBC: 3.45 MIL/uL — ABNORMAL LOW (ref 3.87–5.11)
RDW: 14.6 % (ref 11.5–15.5)
WBC: 9.7 10*3/uL (ref 4.0–10.5)
nRBC: 0 % (ref 0.0–0.2)

## 2019-11-25 LAB — TYPE AND SCREEN
ABO/RH(D): O POS
Antibody Screen: NEGATIVE

## 2019-11-25 LAB — COMPREHENSIVE METABOLIC PANEL
ALT: 18 U/L (ref 0–44)
AST: 32 U/L (ref 15–41)
Albumin: 2.7 g/dL — ABNORMAL LOW (ref 3.5–5.0)
Alkaline Phosphatase: 166 U/L — ABNORMAL HIGH (ref 38–126)
Anion gap: 12 (ref 5–15)
BUN: 10 mg/dL (ref 6–20)
CO2: 19 mmol/L — ABNORMAL LOW (ref 22–32)
Calcium: 9.2 mg/dL (ref 8.9–10.3)
Chloride: 106 mmol/L (ref 98–111)
Creatinine, Ser: 1.28 mg/dL — ABNORMAL HIGH (ref 0.44–1.00)
GFR, Estimated: 54 mL/min — ABNORMAL LOW (ref >60)
Glucose, Bld: 107 mg/dL — ABNORMAL HIGH (ref 70–99)
Potassium: 3.7 mmol/L (ref 3.5–5.1)
Sodium: 137 mmol/L (ref 135–145)
Total Bilirubin: 0.4 mg/dL (ref 0.3–1.2)
Total Protein: 5.8 g/dL — ABNORMAL LOW (ref 6.5–8.1)

## 2019-11-25 LAB — RESPIRATORY PANEL BY RT PCR (FLU A&B, COVID)
Influenza A by PCR: NEGATIVE
Influenza B by PCR: NEGATIVE
SARS Coronavirus 2 by RT PCR: NEGATIVE

## 2019-11-25 LAB — RPR: RPR Ser Ql: NONREACTIVE

## 2019-11-25 MED ORDER — LIDOCAINE HCL (PF) 1 % IJ SOLN
30.0000 mL | INTRAMUSCULAR | Status: DC | PRN
  Filled 2019-11-25: qty 30

## 2019-11-25 MED ORDER — SIMETHICONE 80 MG PO CHEW: 80.0000 mg | CHEWABLE_TABLET | ORAL | Status: DC | PRN

## 2019-11-25 MED ORDER — FLEET ENEMA 7-19 GM/118ML RE ENEM: 1.0000 | ENEMA | RECTAL | Status: DC | PRN

## 2019-11-25 MED ORDER — DIPHENHYDRAMINE HCL 50 MG/ML IJ SOLN
25.0000 mg | Freq: Once | INTRAMUSCULAR | Status: AC
  Administered 2019-11-25: 25 mg via INTRAVENOUS
  Filled 2019-11-25: qty 1

## 2019-11-25 MED ORDER — PHENYLEPHRINE 40 MCG/ML (10ML) SYRINGE FOR IV PUSH (FOR BLOOD PRESSURE SUPPORT)
80.0000 ug | PREFILLED_SYRINGE | INTRAVENOUS | Status: DC | PRN
  Administered 2019-11-25: 80 ug via INTRAVENOUS

## 2019-11-25 MED ORDER — ONDANSETRON HCL 4 MG/2ML IJ SOLN
4.0000 mg | Freq: Four times a day (QID) | INTRAMUSCULAR | Status: DC | PRN
  Administered 2019-11-25 (×2): 4 mg via INTRAVENOUS
  Filled 2019-11-25 (×2): qty 2

## 2019-11-25 MED ORDER — SOD CITRATE-CITRIC ACID 500-334 MG/5ML PO SOLN
30.0000 mL | ORAL | Status: DC | PRN
  Filled 2019-11-25: qty 15

## 2019-11-25 MED ORDER — PNEUMOCOCCAL VAC POLYVALENT 25 MCG/0.5ML IJ INJ
0.5000 mL | INJECTION | INTRAMUSCULAR | Status: AC
  Administered 2019-11-26: 0.5 mL via INTRAMUSCULAR
  Filled 2019-11-25: qty 0.5

## 2019-11-25 MED ORDER — OXYCODONE-ACETAMINOPHEN 5-325 MG PO TABS: 1.0000 | ORAL_TABLET | ORAL | Status: DC | PRN

## 2019-11-25 MED ORDER — EPHEDRINE 5 MG/ML INJ: 10.0000 mg | INTRAVENOUS | Status: DC | PRN

## 2019-11-25 MED ORDER — IBUPROFEN 600 MG PO TABS
600.0000 mg | ORAL_TABLET | Freq: Four times a day (QID) | ORAL | Status: DC
  Administered 2019-11-25 – 2019-11-27 (×8): 600 mg via ORAL
  Filled 2019-11-25 (×8): qty 1

## 2019-11-25 MED ORDER — TETANUS-DIPHTH-ACELL PERTUSSIS 5-2.5-18.5 LF-MCG/0.5 IM SUSP: 0.5000 mL | Freq: Once | INTRAMUSCULAR | Status: DC

## 2019-11-25 MED ORDER — DIBUCAINE (PERIANAL) 1 % EX OINT: 1.0000 "application " | TOPICAL_OINTMENT | CUTANEOUS | Status: DC | PRN

## 2019-11-25 MED ORDER — TERBUTALINE SULFATE 1 MG/ML IJ SOLN
0.2500 mg | Freq: Once | INTRAMUSCULAR | Status: AC
  Administered 2019-11-25: 0.25 mg via SUBCUTANEOUS

## 2019-11-25 MED ORDER — ACETAMINOPHEN 325 MG PO TABS: 650.0000 mg | ORAL_TABLET | ORAL | Status: DC | PRN

## 2019-11-25 MED ORDER — FERROUS SULFATE 325 (65 FE) MG PO TABS
325.0000 mg | ORAL_TABLET | Freq: Two times a day (BID) | ORAL | Status: DC
  Administered 2019-11-25 – 2019-11-27 (×4): 325 mg via ORAL
  Filled 2019-11-25 (×4): qty 1

## 2019-11-25 MED ORDER — LIDOCAINE HCL (PF) 1 % IJ SOLN
INTRAMUSCULAR | Status: DC | PRN
  Administered 2019-11-25: 9 mL via EPIDURAL

## 2019-11-25 MED ORDER — ONDANSETRON HCL 4 MG/2ML IJ SOLN: 4.0000 mg | INTRAMUSCULAR | Status: DC | PRN

## 2019-11-25 MED ORDER — ONDANSETRON HCL 4 MG PO TABS: 4.0000 mg | ORAL_TABLET | ORAL | Status: DC | PRN

## 2019-11-25 MED ORDER — MEASLES, MUMPS & RUBELLA VAC IJ SOLR: 0.5000 mL | Freq: Once | INTRAMUSCULAR | Status: DC

## 2019-11-25 MED ORDER — OXYTOCIN-SODIUM CHLORIDE 30-0.9 UT/500ML-% IV SOLN
2.5000 [IU]/h | INTRAVENOUS | Status: DC
  Filled 2019-11-25: qty 500

## 2019-11-25 MED ORDER — PRENATAL MULTIVITAMIN CH
1.0000 | ORAL_TABLET | Freq: Every day | ORAL | Status: DC
  Administered 2019-11-26 – 2019-11-27 (×2): 1 via ORAL
  Filled 2019-11-25 (×2): qty 1

## 2019-11-25 MED ORDER — LACTATED RINGERS IV SOLN
500.0000 mL | Freq: Once | INTRAVENOUS | Status: AC
  Administered 2019-11-25: 500 mL via INTRAVENOUS

## 2019-11-25 MED ORDER — TERBUTALINE SULFATE 1 MG/ML IJ SOLN
INTRAMUSCULAR | Status: AC
  Administered 2019-11-25: 0.25 mg via SUBCUTANEOUS
  Filled 2019-11-25: qty 1

## 2019-11-25 MED ORDER — FENTANYL CITRATE (PF) 100 MCG/2ML IJ SOLN
INTRAMUSCULAR | Status: AC
  Filled 2019-11-25: qty 2

## 2019-11-25 MED ORDER — ZOLPIDEM TARTRATE 5 MG PO TABS: 5.0000 mg | ORAL_TABLET | Freq: Every evening | ORAL | Status: DC | PRN

## 2019-11-25 MED ORDER — FENTANYL-BUPIVACAINE-NACL 0.5-0.125-0.9 MG/250ML-% EP SOLN
12.0000 mL/h | EPIDURAL | Status: DC | PRN
  Filled 2019-11-25: qty 250

## 2019-11-25 MED ORDER — LACTATED RINGERS IV BOLUS: 500.0000 mL | Freq: Once | INTRAVENOUS | Status: DC

## 2019-11-25 MED ORDER — OXYCODONE-ACETAMINOPHEN 5-325 MG PO TABS: 2.0000 | ORAL_TABLET | ORAL | Status: DC | PRN

## 2019-11-25 MED ORDER — LACTATED RINGERS IV SOLN: INTRAVENOUS | Status: DC

## 2019-11-25 MED ORDER — FENTANYL CITRATE (PF) 100 MCG/2ML IJ SOLN
100.0000 ug | Freq: Once | INTRAMUSCULAR | Status: AC
  Administered 2019-11-25: 100 ug via INTRAVENOUS

## 2019-11-25 MED ORDER — OXYTOCIN BOLUS FROM INFUSION
333.0000 mL | Freq: Once | INTRAVENOUS | Status: AC
  Administered 2019-11-25: 600 mL/h via INTRAVENOUS

## 2019-11-25 MED ORDER — PHENYLEPHRINE 40 MCG/ML (10ML) SYRINGE FOR IV PUSH (FOR BLOOD PRESSURE SUPPORT)
80.0000 ug | PREFILLED_SYRINGE | INTRAVENOUS | Status: DC | PRN
  Filled 2019-11-25: qty 10

## 2019-11-25 MED ORDER — DIPHENHYDRAMINE HCL 50 MG/ML IJ SOLN: 12.5000 mg | INTRAMUSCULAR | Status: DC | PRN

## 2019-11-25 MED ORDER — MEDROXYPROGESTERONE ACETATE 150 MG/ML IM SUSP
150.0000 mg | INTRAMUSCULAR | Status: AC | PRN
  Administered 2019-11-27: 150 mg via INTRAMUSCULAR
  Filled 2019-11-25: qty 1

## 2019-11-25 MED ORDER — TERBUTALINE SULFATE 1 MG/ML IJ SOLN: 0.2500 mg | Freq: Once | INTRAMUSCULAR | Status: AC

## 2019-11-25 MED ORDER — DIPHENHYDRAMINE HCL 25 MG PO CAPS: 25.0000 mg | ORAL_CAPSULE | Freq: Four times a day (QID) | ORAL | Status: DC | PRN

## 2019-11-25 MED ORDER — BENZOCAINE-MENTHOL 20-0.5 % EX AERO: 1.0000 "application " | INHALATION_SPRAY | CUTANEOUS | Status: DC | PRN

## 2019-11-25 MED ORDER — LACTATED RINGERS IV SOLN
500.0000 mL | INTRAVENOUS | Status: DC | PRN
  Administered 2019-11-25: 300 mL via INTRAVENOUS
  Administered 2019-11-25 (×2): 500 mL via INTRAVENOUS

## 2019-11-25 MED ORDER — COCONUT OIL OIL: 1.0000 "application " | TOPICAL_OIL | Status: DC | PRN

## 2019-11-25 MED ORDER — SODIUM CHLORIDE (PF) 0.9 % IJ SOLN
INTRAMUSCULAR | Status: DC | PRN
Start: 2019-11-25 — End: 2019-11-25
  Administered 2019-11-25: 12 mL/h via EPIDURAL

## 2019-11-25 MED ORDER — MAGNESIUM HYDROXIDE 400 MG/5ML PO SUSP: 30.0000 mL | ORAL | Status: DC | PRN

## 2019-11-25 MED ORDER — WITCH HAZEL-GLYCERIN EX PADS: 1.0000 "application " | MEDICATED_PAD | CUTANEOUS | Status: DC | PRN

## 2019-11-25 NOTE — Progress Notes (Signed)
Olivia Sandoval is a 39 y.o. [id number] at [redacted]w[redacted]d.  Subjective: Comfortable w/ UC's  Objective: BP 134/81 (BP Location: Left Arm)   Pulse 95   Temp 98.3 F (36.8 C) (Oral)   Resp 18   Ht 5' (1.524 m)   Wt 60.8 kg   LMP 02/18/2019 (Within Days)   SpO2 100%   Breastfeeding Unknown   BMI 26.17 kg/m    FHT:  FHR: 160 bpm, variability: mod,  accelerations:  10x10,  decelerations:  Prolonged and late decels not improving w/ position changes, bolus. Amnioinfusion stopped due to poor return and concern for distended uterus w/ out adequate resting tone.  UC:   Q 1-3 minutes, strong.  Dilation: 10 Dilation Complete Date: 11/25/19 Dilation Complete Time: 1221 Effacement (%): 50 Station: 2 Presentation: Vertex Exam by:: Dorathy Kinsman, CNM  Labs: NA  Assessment / Plan: [redacted]w[redacted]d week IUP Labor: Start second stage Fetal Wellbeing:  Category II, still moderate variability and accels.  Pain Control:  Epdural Anticipated MOD:  Uncertain. Dr. Macon Large informed of decels, intervention. Now at [facility]. Discussed possible C/S vs vacuum if indicated. Pt agrees. Inadequate Urine OP: None over past two hours. Per Dr. Macon Large will address after delivery due to FHR.   Katrinka Blazing, [place], CNM 11/25/2019 12:21 PM

## 2019-11-25 NOTE — Progress Notes (Signed)
Patient ID: Olivia Sandoval, female   DOB: 03/03/1980, 39 y.o.   MRN: [id number] Pt cathed by RN--900 ml pale yellow urine.

## 2019-11-25 NOTE — Progress Notes (Addendum)
Patient ID: Olivia Sandoval, female   DOB: [birth year], 38 y.o.   MRN: [id number] FHR deceleration occurred about 0345 Position changes employed with some recovery but then decels recurred  AROM clear fluid Dilation: 6 Effacement (%): 50 Station: -2 Presentation: Vertex Exam by:: Mayford Knife, M CNM Anterior portion of cervix is edematous, presumably from uncontrolled pushing by patient over past half hour or so  IUPC and FSE inserted Amnioinfusion starte Terbutaline given to rest baby  FHR recovered by about 0405 (just after the first decel, FHR recovered then reverted to decel, back and forth over the following time until 0405.    Positioned on side, FHR remains stable

## 2019-11-25 NOTE — Anesthesia Procedure Notes (Signed)
Epidural Patient location during procedure: OB Start time: 11/25/2019 2:40 AM End time: 11/25/2019 2:42 AM  Staffing Anesthesiologist: Leilani Able, MD Performed: anesthesiologist   Preanesthetic Checklist Completed: patient identified, IV checked, site marked, risks and benefits discussed, surgical consent, monitors and equipment checked, pre-op evaluation and timeout performed  Epidural Patient position: sitting Prep: DuraPrep and site prepped and draped Patient monitoring: continuous pulse ox and blood pressure Approach: midline Injection technique: LOR air  Needle:  Needle type: Tuohy  Needle gauge: 17 G Needle length: 9 cm and 9 Needle insertion depth: 5 cm cm Catheter type: closed end flexible Catheter size: 19 Gauge Catheter at skin depth: 10 cm Test dose: negative and Other  Assessment Events: blood not aspirated, injection not painful, no injection resistance, no paresthesia and negative IV test  Additional Notes Reason for block:procedure for pain

## 2019-11-25 NOTE — Anesthesia Preprocedure Evaluation (Signed)
Anesthesia Evaluation  Patient identified by MRN, date of birth, ID band Patient awake    Reviewed: Allergy & Precautions, H&P , NPO status , Patient's Chart, lab work & pertinent test results  Airway Mallampati: I       Dental no notable dental hx.    Pulmonary Current Smoker,    Pulmonary exam normal        Cardiovascular negative cardio ROS Normal cardiovascular exam     Neuro/Psych negative neurological ROS  negative psych ROS   GI/Hepatic negative GI ROS, Neg liver ROS,   Endo/Other  negative endocrine ROS  Renal/GU negative Renal ROS  negative genitourinary   Musculoskeletal negative musculoskeletal ROS (+)   Abdominal Normal abdominal exam  (+)   Peds  Hematology negative hematology ROS (+)   Anesthesia Other Findings   Reproductive/Obstetrics (+) Pregnancy                             Anesthesia Physical Anesthesia Plan  ASA: II  Anesthesia Plan: Epidural   Post-op Pain Management:    Induction:   PONV Risk Score and Plan:   Airway Management Planned:   Additional Equipment:   Intra-op Plan:   Post-operative Plan:   Informed Consent: I have reviewed the patients History and Physical, chart, labs and discussed the procedure including the risks, benefits and alternatives for the proposed anesthesia with the patient or authorized representative who has indicated his/her understanding and acceptance.       Plan Discussed with:   Anesthesia Plan Comments:         Anesthesia Quick Evaluation

## 2019-11-25 NOTE — Progress Notes (Signed)
Labor Progress Note Olivia Sandoval is a 39 y.o. G3P1011 at [redacted]w[redacted]d presented for spontaneous labor.   S: Pt was found sleeping soundly.   O:  BP (!) 97/55   Pulse 91   Temp 97.8 F (36.6 C) (Oral)   Resp 18   Ht 5' (1.524 m)   Wt 60.8 kg   LMP 02/18/2019 (Within Days)   BMI 26.17 kg/m  EFM:  Baseline 135 bpm / minimal to moderate variability / accels present, some variables and multiple late decels  CVE: Dilation: 6 Effacement (%): 50 Station: -2 Presentation: Vertex Exam by:: Artelia Laroche CNM   A&P: 39 y.o. G3P1011 [redacted]w[redacted]d presented for spontaneous labor.  #Labor: Progressing. S/p AROM/IUPC/FSE/amnio @ 0345 and Terb x1 @ 0345 #Pain: Epidural #FWB: Cat 2 for variable decels, multiple late decels #GBS negative   Fayette Pho, MD 5:48 AM

## 2019-11-25 NOTE — H&P (Addendum)
Olivia Sandoval is a 39 y.o. female presenting for painful contractions.  Has been followed for OB care at [facility].  Pregnancy has been remarkalble for AMA (normal NIPS), Trichomonas in pregnancy (treated), Marijuana + @NOB ,   .TDAP 09/14/19 Covid  Not asked  OB History    Gravida  3   Para  1   Term  1   Preterm      AB  1   Living  1     SAB      TAB      Ectopic      Multiple      Live Births  1          Past Medical History:  Diagnosis Date  . Abortion    Past Surgical History:  Procedure Laterality Date  . NO PAST SURGERIES     Family History: family history is not on file. Social History:  reports that she has been smoking cigarettes. She has never used smokeless tobacco. She reports previous alcohol use. She reports previous drug use.     Maternal Diabetes: No Genetic Screening: Normal Maternal Ultrasounds/Referrals: Normal Fetal Ultrasounds or other Referrals:  None Maternal Substance Abuse:  Yes:  Type: Marijuana Significant Maternal Medications:  None Significant Maternal Lab Results:  Group B Strep negative Other Comments:  AMA with normal NIPS  Review of Systems  Constitutional: Negative for chills and fever.  Respiratory: Negative for shortness of breath.   Gastrointestinal: Positive for abdominal pain. Negative for constipation, diarrhea, nausea and vomiting.  Genitourinary: Positive for pelvic pain. Negative for vaginal bleeding.  Musculoskeletal: Positive for back pain.  Neurological: Negative for weakness and headaches.   Maternal Medical History:  Reason for admission: Contractions.  Nausea.  Contractions: Onset was 1-2 hours ago.   Perceived severity is strong.    Fetal activity: Perceived fetal activity is normal.   Last perceived fetal movement was within the past hour.    Prenatal complications: No bleeding, PIH, infection, placental abnormality, pre-eclampsia or preterm labor.   Prenatal Complications - Diabetes:  none.    Dilation: 3.5 Effacement (%): 90 Station: -2 Exam by:: GInger Morris RN Last menstrual period 02/18/2019. Maternal Exam:  Uterine Assessment: Contraction strength is firm.  Abdomen: Patient reports no abdominal tenderness. Fetal presentation: vertex  Introitus: Normal vulva. Normal vagina.  Ferning test: not done.  Nitrazine test: not done.  Pelvis: adequate for delivery.   Cervix: Cervix evaluated by digital exam.   Dilation: 4 Effacement (%): 90 Station: -2 Presentation: Vertex Exam by:: C Hernadez RN     Fetal Exam Fetal Monitor Review: Mode: ultrasound.   Baseline rate: 130.  Variability: moderate (6-25 bpm).   Pattern: variable decelerations and accelerations present.    Fetal State Assessment: Category I - tracings are normal. accels are 10-12 beat accels, small variable decels.  Physical Exam Constitutional:      General: She is not in acute distress.    Appearance: She is not toxic-appearing.  HENT:     Head: Normocephalic.  Cardiovascular:     Rate and Rhythm: Normal rate and regular rhythm.  Pulmonary:     Effort: Pulmonary effort is normal. No respiratory distress.  Abdominal:     Tenderness: There is no guarding or rebound.  Genitourinary:    General: Normal vulva.     Comments: Dilation: 4 Effacement (%): 90 Station: -2 Presentation: Vertex Exam by:: [email or web address] RN   Musculoskeletal:        General:  Normal range of motion.     Cervical back: Normal range of motion.  Skin:    General: Skin is warm and dry.  Neurological:     General: No focal deficit present.     Mental Status: She is alert.  Psychiatric:        Mood and Affect: Mood normal.     Prenatal labs: ABO, Rh: O/Positive/-- (04/08 1219) Antibody: Negative (04/08 1219) Rubella: 8.18 (04/08 1219) RPR: Non Reactive (07/30 1556)  HBsAg: Negative (04/08 1219)  HIV: Non Reactive (07/30 1556)  GBS: Negative/-- (09/27 0925)   Assessment/Plan: Single IUP at [redacted]w[redacted]d Active  Labor GBS Negative AMA  Admit to Labor and Delivery Routine orders Epidural PRN Anticipate SVD   Wynelle Bourgeois 11/25/2019, 1:44 AM

## 2019-11-25 NOTE — Progress Notes (Signed)
Received phone call from RN.  Patient status post vaginal delivery with epidural.  After delivery, patient required in and out cath twice. Bladder scan with >900cc. Attempt to void  - if unable, in and out and give time for epidural to wear off.  - if no UOP later this evening, consider foley for overnight and voiding challenge in the morning.   Melene Plan, M.D.  5:38 PM 11/25/2019

## 2019-11-25 NOTE — Progress Notes (Signed)
Olivia Sandoval is a 39 y.o. G3P1011 at [redacted]w[redacted]d.  Subjective: Comfortable w/ UC's  Informed by RN of inadequate urine OP--possible only 75 cc's over 6 hours, but some question of documentation from previous shift.   Objective: BP 128/64   Pulse (!) 104   Temp 99.3 F (37.4 C) (Oral)   Resp 16   Ht 5' (1.524 m)   Wt 60.8 kg   LMP 02/18/2019 (Within Days)   BMI 26.17 kg/m    FHT:  FHR: 140 bpm, variability: mod,  accelerations:  15x15,  decelerations:  repetitive variables, occasional prolonged decels  UC:   Q 1-4 minutes, strong Dilation: 8 Effacement (%): 90, thick anterior lip, not able to reduce, but very stretchy.  Station: 0 Presentation: Vertex Exam by:: Dorathy Kinsman, CNM + return from amnioinfusion.   Labs: NA  25 cc OP from foley.   Assessment / Plan: [redacted]w[redacted]d week IUP Labor: Transition Fetal Wellbeing:  Category II, but w/ mod variability, spontaneous 15x15 accels and + scalp stim. Appropriate to continue attempt at vaginal for now. Progressing well.  Pain Control:  Epidural Anticipated MOD:  Hopeful for vaginal delivery.  Urinary retention vs poor urine OP: Already had 500 ml bolus. Suspect urinary retention due to vtx low in pelvis. If pt not delivered and OP not adequate over next two hours will try IO cath and lifting up on fetal Vtx to try to drain bladder. If still inadequate will check CMET and give additional bolus.   Katrinka Blazing, [place], CNM 11/25/2019 10:20 AM

## 2019-11-25 NOTE — Progress Notes (Addendum)
Spoke to Dr. Selena Batten (Resident) about pt c/o pain and abdominal distention.  Bladder Scan revealed .  Sugary drink/cocktail given and pt was able to empty some out, she states she thinks she got about 300cc out. Will Bladder Scan afterwards.  Pt continues to void at the moment.  She just yelled out, "There's 300 more right there".    Dr. Selena Batten would like RN to I/O cath if greater than per bladder scan.  Also, clarified the need for LR bolus.  This is not needed right now (was in my orders after they were released when pt arrived to [facility])  Will continue to monitor.

## 2019-11-25 NOTE — Discharge Summary (Signed)
Postpartum Discharge Summary    Patient Name: Olivia Sandoval DOB: 11/04/1980 MRN: [id number]  Date of admission: 11/25/2019 Delivery date:11/25/2019  Delivering provider: Manya Silvas  Date of discharge: 11/27/2019  Admitting diagnosis: Pregnancy [Z34.90] Intrauterine pregnancy: [redacted]w[redacted]d    Secondary diagnosis:  Active Problems:   Advanced maternal age in multigravida   Pregnancy   Vaginal delivery  Additional problems: history of marijuana use, hx of trichomonas during pregnancy    Discharge diagnosis: Term Pregnancy Delivered                                              Post partum procedures: Depo injection  Augmentation: None [facility]: [facility]set of Labor With Vaginal Delivery      39y.o. yo [id number] [id number] admitted in Active Labor on 11/25/2019. Patient had a labor course complicated by repetitive variable and late decels. Received amnioinfusion, terbutaline. Course was as follows:  Membrane Rupture Time/Date: 3:55 AM ,11/25/2019   Delivery Method:Vaginal, Spontaneous  Episiotomy: None  Lacerations:  None  Patient had an uncomplicated postpartum course.  She is ambulating, tolerating a regular diet, passing flatus, and urinating well. Patient is discharged home in stable condition on 11/27/19.  Newborn Data: Birth date:11/25/2019  Birth time:12:39 PM  Gender:Female  Living status:Living  Apgars:6 ,9  Weight:2815 g   Magnesium Sulfate received: No BMZ received: No Rhophylac:N/A MMR:N/A T-DaP:Given prenatally Flu: No Transfusion:No  Physical exam  Vitals:   11/26/19 0450 11/26/19 1456 11/26/19 2100 11/27/19 0525  BP: 120/77 (!) 121/100 120/74 109/65  Pulse: 74 78 79 71  Resp: 16 17 16    Temp: 98.3 F (36.8 C) 97.9 F (36.6 C) 98.5 F (36.9 C) 98.3 F (36.8 C)  TempSrc: Oral Oral Oral   SpO2: 100% 100%    Weight:      Height:       General: alert, cooperative and no distress Lochia: appropriate Uterine  Fundus: firm Incision: N/A DVT Evaluation: No evidence of DVT seen on physical exam. No cords or calf tenderness. No significant calf/ankle edema. Labs: Lab Results  Component Value Date   WBC 9.7 11/25/2019   HGB 11.2 (L) 11/25/2019   HCT 37.6 11/25/2019   MCV 109.0 (H) 11/25/2019   PLT 317 11/25/2019   CMP Latest Ref Rng & Units 11/26/2019  Glucose 70 - 99 mg/dL 76  BUN 6 - 20 mg/dL 7  Creatinine 0.44 - 1.00 mg/dL 0.70  Sodium 135 - 145 mmol/L 136  Potassium 3.5 - 5.1 mmol/L 3.6  Chloride 98 - 111 mmol/L 107  CO2 22 - 32 mmol/L 20(L)  Calcium 8.9 - 10.3 mg/dL 8.8(L)  Total Protein 6.5 - 8.1 g/dL 5.1(L)  Total Bilirubin 0.3 - 1.2 mg/dL 0.4  Alkaline Phos 38 - 126 U/L 138(H)  AST 15 - 41 U/L 28  ALT 0 - 44 U/L 14   Edinburgh Score: Edinburgh Postnatal Depression Scale Screening Tool 11/25/2019  I have been able to laugh and see the funny side of things. 0  I have looked forward with enjoyment to things. 0  I have blamed myself unnecessarily when things went wrong. 1  I have been anxious or worried for no good reason. 0  I have felt scared or panicky for no good reason. 0  Things have been getting on top of me.  1  I have been so unhappy that I have had difficulty sleeping. 0  I have felt sad or miserable. 0  I have been so unhappy that I have been crying. 0  The thought of harming myself has occurred to me. 0  Edinburgh Postnatal Depression Scale Total 2     After visit meds:  Allergies as of 11/27/2019      Reactions   Penicillins Itching, Rash      Medication List    TAKE these medications   Butalbital-APAP-Caffeine 50-325-40 MG capsule Take 1-2 capsules by mouth every 6 (six) hours as needed for headache.   ibuprofen 600 MG tablet Commonly known as: ADVIL Take 1 tablet (600 mg total) by mouth every 6 (six) hours as needed for moderate pain or cramping.   PRENATAL VITAMIN PO Take 1 capsule by mouth daily.   promethazine 25 MG tablet Commonly known as:  PHENERGAN Take 25 mg by mouth every 8 (eight) hours as needed. Nausea or vomiting        Discharge home in stable condition Infant Feeding: Bottle Infant Disposition:home with mother Discharge instruction: per After Visit Summary and Postpartum booklet. Activity: Advance as tolerated. Pelvic rest for 6 weeks.  Diet: routine diet Future Appointments: No future appointments. Follow up Visit:  [place]. Schedule an appointment as soon as possible for a visit in 4 week(s).   Specialty: Obstetrics and Gynecology Why: Make appointment to be seen in 4 weeks for postpartum care Contact information: [address] [place] [address] [phone]             Patient plans follow up at [facility]  11/27/2019 Lajean Manes, [place]

## 2019-11-26 LAB — COMPREHENSIVE METABOLIC PANEL
ALT: 14 U/L (ref 0–44)
AST: 28 U/L (ref 15–41)
Albumin: 2.1 g/dL — ABNORMAL LOW (ref 3.5–5.0)
Alkaline Phosphatase: 138 U/L — ABNORMAL HIGH (ref 38–126)
Anion gap: 9 (ref 5–15)
BUN: 7 mg/dL (ref 6–20)
CO2: 20 mmol/L — ABNORMAL LOW (ref 22–32)
Calcium: 8.8 mg/dL — ABNORMAL LOW (ref 8.9–10.3)
Chloride: 107 mmol/L (ref 98–111)
Creatinine, Ser: 0.7 mg/dL (ref 0.44–1.00)
GFR, Estimated: >60 mL/min (ref >60)
Glucose, Bld: 76 mg/dL (ref 70–99)
Potassium: 3.6 mmol/L (ref 3.5–5.1)
Sodium: 136 mmol/L (ref 135–145)
Total Bilirubin: 0.4 mg/dL (ref 0.3–1.2)
Total Protein: 5.1 g/dL — ABNORMAL LOW (ref 6.5–8.1)

## 2019-11-26 NOTE — Progress Notes (Signed)
Post Partum Day # 1 Subjective: Pt without complaints this morning. Ambulating, voiding, tolerating diet and good oral pain control  Objective: Blood pressure 120/77, pulse 74, temperature 98.3 F (36.8 C), temperature source Oral, resp. rate 16, height 5' (1.524 m), weight 60.8 kg, last menstrual period 02/18/2019, SpO2 100 %, unknown if currently breastfeeding.  Physical Exam:  General: alert Lochia: appropriate Uterine Fundus: firm Incision: healing well DVT Evaluation: No evidence of DVT seen on physical exam.  Recent Labs    11/25/19 0126  HGB 11.2*  HCT 37.6    Assessment/Plan: Stable Desires circ Continue with progressive care Plan d/c tomorrow     Patient desires circumcision for her female infant.  Circumcision procedure details discussed, risks and benefits of procedure were also discussed.  These include but are not limited to: Benefits of circumcision in men include reduction in the rates of urinary tract infection (UTI), penile cancer, some sexually transmitted infections, penile inflammatory and retractile disorders, as well as easier hygiene.  Risks include bleeding , infection, injury of glans which may lead to penile deformity or urinary tract issues, unsatisfactory cosmetic appearance and other potential complications related to the procedure.  It was emphasized that this is an elective procedure.  Patient wants to proceed with circumcision; written informed consent obtained.  Will do circumcision soon, routine circumcision and post circumcision care ordered for the infant.       LOS: 1 day   Olivia Sandoval 11/26/2019, 10:49 AM

## 2019-11-26 NOTE — Anesthesia Postprocedure Evaluation (Signed)
Anesthesia Post Note  Patient: Olivia Sandoval  Procedure(s) Performed: AN AD HOC LABOR EPIDURAL     Patient location during evaluation: Mother Baby Anesthesia Type: Epidural Level of consciousness: awake and alert Pain management: pain level controlled Vital Signs Assessment: post-procedure vital signs reviewed and stable Respiratory status: spontaneous breathing, nonlabored ventilation and respiratory function stable Cardiovascular status: stable Postop Assessment: no headache, no backache and epidural receding Anesthetic complications: no   No complications documented.  Last Vitals:  Vitals:   11/25/19 2322 11/26/19 0450  BP: 111/61 120/77  Pulse: 65 74  Resp: 16 16  Temp: 36.5 C 36.8 C  SpO2: 100% 100%    Last Pain:  Vitals:   11/26/19 0800  TempSrc:   PainSc: 0-No pain   Pain Goal:                   Tamkia Temples

## 2019-11-26 NOTE — Clinical Social Work Maternal (Signed)
CLINICAL SOCIAL WORK MATERNAL/CHILD NOTE  Patient Details  Name: Olivia Sandoval MRN: [id number] Date of Birth: 04/22/1980  Date:  April 21, 2019  Clinical Social Worker Initiating Note:  Montel Clock Coltan Spinello LCSW Date/Time: Initiated:  11/26/19/0915     Child's Name:  Rosemarie Ax   Biological Parents:  Mother, Father Malachy Chamber, Manuela Schwartz)   Need for Interpreter:  None   Reason for Referral:  Current Substance Use/Substance Use During Pregnancy    Address:  [address] [place] [address]    Phone number:  [phone] (home)     Additional phone number: none  Household Members/Support Persons (HM/SP):   Household Member/Support Person 1   HM/SP Name Relationship DOB or Age  HM/SP -1  Malachy Chamber  [facility] 02/09/1983  HM/SP -2 Grayland Tally  FOB  01/16/1976  HM/SP -3 Kendyll Belson son 04/24/2006  HM/SP -4        HM/SP -5        HM/SP -6        HM/SP -7        HM/SP -8          Natural Supports (not living in the home):  Parent   Professional Supports: None   Employment: Full-time   Type of Work: on leave from [employer]   Education:  [employer] school graduate MOB reported that she did attend some College.   Homebound arranged:  n/a  Financial Resources:  Medicaid   Other Resources:  [facility]   Cultural/Religious Considerations Which May Impact Care: none reported.   Strengths:  Ability to meet basic needs , Compliance with medical plan , Home prepared for child , Pediatrician chosen   Psychotropic Medications:   none reported.       Pediatrician:    [facility]  Pediatrician List:   [facility]  [facility]      Pediatrician Fax Number:    Risk Factors/Current Problems:  None   Cognitive State:  Able to Concentrate , Insightful , Alert , Linear Thinking    Mood/Affect:  Relaxed , Comfortable , Calm , Interested , Happy     CSW Assessment: CSW consulted as MOB reported THC use in pregnancy. CSW went to speak with MOB at bedside to address further needs.  CSW congratulated MOB on the birth of infant. CSW advised MOB of CSW's role and the reason for CSW coming to speak her. MOB expressed that she did use THC at the start of her pregnancy. MOB expressed that she was unable to eat and became worried about "my baby being to small". MOB reported that she would use "maybe once a day". CSW understanding and asked MOB when her last use was. MOB reported not being sure of this. CSW advised MOB of the hospital drug screen policy. CSW advised MOB that if infants UDS or CDS return positive then CSW must make a CPS report. MOB reported that she understood and reported no previous or current CPS hx to this CSW. MOB reported that had no further questions regarding the drug screen policy at this time. CSW encouraged MOB to keep cotton balls in infants pamper and call RN when infant has voided. MOB verbalized understanding and reported "he just hasn't peed".   CSW inquired from MOB on mental health hx in which MOB expressed that she has no known mental health hx. MOB reported no  SI or HI and expressed not being involved in DV. MOB reported that she is from home with FOB and her 13 year old son who are also MOB's reported suppots. MOB expressed that she feels that she has all needed items to care for infant with plans to get WIC established once arrived home. MOB reported that she has a basinet for infant ot sleep in once arrived home as well as MOB voiced that infant would be seen at [facility] for further care.   CSW took time to provide MOB with PPD and SIDS education. MOB was given PPD Checklist in order to keep track of feelings as they may relate to PPD. MOB thanked CSW and expressed no other needs.  During this assessment MOB was sitting up in bed holding infant. MOB appeared to be feeding infant, but also very attentive in  speaking with CSW.  CSW will continue to monitor infants UDS and CDS and make CPS report if warranted.     CSW Plan/Description:  No Further Intervention Required/No Barriers to Discharge, Sudden Infant Death Syndrome (SIDS) Education, Perinatal Mood and Anxiety Disorder (PMADs) Education, CSW Will Continue to Monitor Umbilical Cord Tissue Drug Screen Results and Make Report if Warranted, Hospital Drug Screen Policy Information    Charvi Gammage S Yzabelle Calles, LCSWA 11/26/2019, 9:38 AM  

## 2019-11-27 ENCOUNTER — Inpatient Hospital Stay: Payer: Medicaid Other

## 2019-11-27 DIAGNOSIS — Z23 Encounter for immunization: Secondary | ICD-10-CM

## 2019-11-27 LAB — SURGICAL PATHOLOGY

## 2019-11-27 MED ORDER — IBUPROFEN 600 MG PO TABS: 600.0000 mg | ORAL_TABLET | Freq: Four times a day (QID) | ORAL | 0 refills | Status: AC | PRN

## 2019-11-27 NOTE — Progress Notes (Signed)
CSW spoke with K. Ridder from [place] CPS. CSW was advised that she is on the way to the hospital at this time to meet with MOB and complete "Safe Plan of Care". CSW updated RN of this and will update security of CPS's anticipated arrival.    Sheanna Dail S. Shloima Clinch, MSW, LCSW [facility] [place] [phone]  

## 2019-11-27 NOTE — Progress Notes (Signed)
CSW spoke with K. Ridder with [place] CPS and was advised that she would be out to hospital to speak with MOB today  aound 5pm regarding infants positive UDS.   Per [place] CPS, there are barrier's to d/c at this time.     Faiz Weber S. Breslyn Abdo, MSW, LCSW [facility] [place] [phone]   

## 2019-11-27 NOTE — Progress Notes (Signed)
   Covid-19 Vaccination Clinic  Name:  ICEL CASTLES    MRN: [id number] DOB: 1980-11-15  11/27/2019  Ms. Louthan was observed post Covid-19 immunization for 15 minutes without incident. She was provided with Vaccine Information Sheet and instruction to access the V-Safe system.   Ms. Remick was instructed to call 911 with any severe reactions post vaccine: Marland Kitchen Difficulty breathing  . Swelling of face and throat  . A fast heartbeat  . A bad rash all over body  . Dizziness and weakness   Immunizations Administered    Name Date Dose VIS Date Route   Moderna COVID-19 Vaccine 11/27/2019  9:50 AM 0.5 mL 01/2019 Intramuscular   Manufacturer: Gala Murdoch   Lot: [id number]   NDC: 54627-035-00      Covid-19 Vaccination Clinic  Name:  IMOGEN MADDALENA    MRN: [id number] DOB: April 26, 1980  11/27/2019  Ms. Ezelle was observed post Covid-19 immunization for 15 minutes without incident. She was provided with Vaccine Information Sheet and instruction to access the V-Safe system.   Ms. Ligas was instructed to call 911 with any severe reactions post vaccine: Marland Kitchen Difficulty breathing  . Swelling of face and throat  . A fast heartbeat  . A bad rash all over body  . Dizziness and weakness   Immunizations Administered    Name Date Dose VIS Date Route   Moderna COVID-19 Vaccine 11/27/2019  9:50 AM 0.5 mL 01/2019 Intramuscular   Manufacturer: Moderna   Lot: [id number]   NDC: 93810-175-10

## 2019-11-27 NOTE — Progress Notes (Signed)
Pt received 1st dose of Moderna in (r) deltoid.  Consent given by Dr. Macon Large.  Skip Mayer, RN-BSN-CCM

## 2019-11-27 NOTE — Discharge Planning (Addendum)
Olivia Sandoval called nurse to verify that [facility] CPS cleared pt for discharge for today. [name] RN [phone]

## 2019-11-27 NOTE — Progress Notes (Addendum)
9:12am-CSW made [place] CPS report to [place]. Per CPS, no barriers to MOB taking infant home.   8:36am-CSW left message for [place] CPS at this time.    CSW aware that infants UDS returned positive for THC. CSW has reached out to [place] CPS to make report, CSW currently on hold at this time.    Karel Mowers S. Mellie Buccellato, MSW, LCSW [facility] [place] [phone]   

## 2019-11-28 ENCOUNTER — Ambulatory Visit: Payer: Medicaid Other

## 2019-11-28 ENCOUNTER — Encounter: Payer: Medicaid Other | Admitting: Obstetrics and Gynecology

## 2020-01-07 ENCOUNTER — Encounter: Payer: Self-pay | Admitting: Obstetrics & Gynecology

## 2020-01-07 ENCOUNTER — Other Ambulatory Visit: Payer: Self-pay

## 2020-01-07 ENCOUNTER — Ambulatory Visit (INDEPENDENT_AMBULATORY_CARE_PROVIDER_SITE_OTHER): Payer: Medicaid Other | Admitting: Obstetrics & Gynecology

## 2020-01-07 VITALS — BP 130/90 | Ht 60.0 in | Wt 124.0 lb

## 2020-01-07 DIAGNOSIS — Z3009 Encounter for other general counseling and advice on contraception: Secondary | ICD-10-CM

## 2020-01-07 NOTE — Progress Notes (Signed)
  OBSTETRICS POSTPARTUM CLINIC PROGRESS NOTE  Subjective:     Olivia Sandoval is a 39 y.o. [phone] female who presents for a postpartum visit. She is 6 weeks postpartum following a Term pregnancy and delivery by Vaginal, no problems at delivery.  I have fully reviewed the prenatal and intrapartum course. Anesthesia: epidural.  Postpartum course has been complicated by uncomplicated.  Baby is feeding by Bottle.  Bleeding: patient has not  resumed menses.  Bowel function is normal. Bladder function is normal.  Patient is not sexually active. Contraception method desired is tubal ligation and pt did receive Depo prior to discharge from hospital.  Postpartum depression screening: negative. Edinburgh 1.  The following portions of the patient's history were reviewed and updated as appropriate: allergies, current medications, past family history, past medical history, past social history and past surgical history.  Review of Systems Pertinent items are noted in HPI.  Objective:    BP 130/90   Ht 5' (1.524 m)   Wt 124 lb (56.2 kg)   LMP 02/18/2019 (Within Days)   BMI 24.22 kg/m   General:  alert and no distress   Breasts:  inspection negative, no nipple discharge or bleeding, no masses or nodularity palpable  Lungs: clear to auscultation bilaterally  Heart:  regular rate and rhythm, S1, S2 normal, no murmur, click, rub or gallop  Abdomen: soft, non-tender; bowel sounds normal; no masses,  no organomegaly.     Vulva:  normal  Vagina: normal vagina, no discharge, exudate, lesion, or erythema  Cervix:  no cervical motion tenderness and no lesions  Corpus: normal size, contour, position, consistency, mobility, non-tender  Adnexa:  normal adnexa and no mass, fullness, tenderness  Rectal Exam: Not performed.          Assessment:     ICD-10-CM   1. Sterilization consult  Z30.09   2. Postpartum care and examination  Z39.2     Plan:  See orders and Patient Instructions Follow up in: a  few weeks (PREOP) or as needed.  Tubal consent papers signed today Plan Tubal in Dec; pros and cons discussed of perm,anent sterility Work note  Annamarie Major, MD, [facility], [facility] 01/07/2020  10:00 AM

## 2020-01-07 NOTE — Patient Instructions (Signed)
Surgery to Prevent Pregnancy Female sterilization is surgery to prevent pregnancy. In this surgery, the fallopian tubes are either blocked or closed off. When the fallopian tubes are closed, the eggs that the ovaries release cannot enter the uterus, sperm cannot reach the eggs, and you cannot get pregnant. Sterilization is permanent. It should only be done if you are sure that you do not want to be able to have children. What are the sterilization surgery options? There are several kinds of female sterilization surgeries. They include:  Laparoscopic tubal ligation. In this surgery, the fallopian tubes are tied off, sealed with heat, or blocked with a clip, ring, or clamp. A small portion of each fallopian tube may also be removed. This surgery is done through several small cuts (incisions) with special instruments that are inserted into your abdomen.  Postpartum tubal ligation. This is also called a mini-laparotomy. This surgery is done right after childbirth or 1 or 2 days after childbirth. In this surgery, the fallopian tubes are tied off, sealed with heat, or blocked with a clip, ring, or clamp. A small portion of each fallopian tube may also be removed. The surgery is done through a single incision in the abdomen.  Tubal ligation during a C-section. In this surgery, the fallopian tubes are tied off, sealed with heat, or blocked with a clip, ring, or clamp. A small portion of each fallopian tube may also be removed. The surgery is done at the same time as a C-section delivery. Is sterilization safe? Generally, sterilization is safe. Complications are rare. However, there are risks. They include:  Bleeding.  Infection.  Reaction to medicine used during the procedure.  Injury to surrounding organs.  Failure of the procedure. How effective is sterilization? Sterilization is nearly 100% effective, but it can fail. In rare cases, the fallopian tubes can grow back together over time. If this  happens, pregnancy may be possible and you will be able to get pregnant again. Women who have had this procedure have a higher chance of having an ectopic pregnancy. An ectopic pregnancy is a pregnancy that happens outside of the uterus. This kind of pregnancy can lead to serious bleeding if it is not treated. What are the benefits?  It is usually effective for a lifetime.  It is usually safe.  It does not have the drawbacks of other types of birth control in that your hormones are not affected. Because of this, your menstrual periods, sexual desire, and sexual performance will not be affected. What are the drawbacks?  You will need to recover and may have complications after surgery.  If you change your mind and decide that you want to have children, you may not be able to. Sterilization may be reversed, but a reversal is not always successful.  It does not provide protection against STDs (sexually transmitted diseases).  It increases the chance of having an ectopic pregnancy. Follow these instructions at home:  Keep all follow-up visits as told by your health care provider. This is important. Summary  Female sterilization is surgery to prevent pregnancy.  There are different types of female sterilization surgeries.  Sterilization may be reversed, but a reversal is not always successful.  Sterilization does not protect against STDs. This information is not intended to replace advice given to you by your health care provider. Make sure you discuss any questions you have with your health care provider. Document Revised: 07/19/2018 Document Reviewed: 10/14/2017 Elsevier Patient Education  2020 [employer].

## 2020-01-08 ENCOUNTER — Telehealth: Payer: Self-pay | Admitting: Obstetrics & Gynecology

## 2020-01-08 NOTE — Telephone Encounter (Signed)
-----   Message from Nadara Mustard, MD sent at 01/07/2020 10:03 AM EST ----- Regarding: SURGERY Tubal papers signed today, needs in Dec due to Medicaid expiring 12/31  Surgery Booking Request Patient Full Name:  Olivia Sandoval  MRN: [id number]  DOB: [birth year]  Surgeon: Letitia Libra, MD  Requested Surgery Date and Time: 12/28 or 23 Primary Diagnosis AND Code: Sterilization Z30.09 Secondary Diagnosis and Code:  Surgical Procedure: Laparoscopy with Tubal Partial Salingectomy RNFA Requested?: No L&D Notification: No Admission Status: same day surgery Length of Surgery: 25 min Special Case Needs: No H&P: Yes Phone Interview???:  Yes Interpreter: No Medical Clearance:  No Special Scheduling Instructions: No Any known health/anesthesia issues, diabetes, sleep apnea, latex allergy, defibrillator/pacemaker?: No Acuity: P3   (P1 highest, P2 delay may cause harm, P3 low, elective gyn, P4 lowest)

## 2020-01-08 NOTE — Telephone Encounter (Signed)
Called pt but VM not set up 

## 2020-01-08 NOTE — Telephone Encounter (Signed)
Called patient to schedule Laparoscopy with Tubal Partial Salpingectomy w Tiburcio Pea  DOS 12/23  H&P 12/15 @ 1:45    Covid testing 12/21 @ 8-10:30, [address], drive up and wear mask. Advised pt to quarantine until DOS.  Pre-admit phone call appointment to be requested - date and time will be included on H&P paper work. Also all appointments will be updated on pt MyChart. Explained that this appointment has a call window. Based on the time scheduled will indicate if the call will be received within a 4 hour window before 1:00 or after.  Advised that pt may also receive calls from the hospital pharmacy and pre-service center.  Confirmed pt has [employer] as primary insurance. No secondary insurance.

## 2020-01-30 ENCOUNTER — Ambulatory Visit (INDEPENDENT_AMBULATORY_CARE_PROVIDER_SITE_OTHER): Payer: Medicaid Other | Admitting: Obstetrics & Gynecology

## 2020-01-30 ENCOUNTER — Other Ambulatory Visit: Payer: Self-pay

## 2020-01-30 ENCOUNTER — Encounter: Payer: Self-pay | Admitting: Obstetrics & Gynecology

## 2020-01-30 VITALS — BP 130/80 | Ht 60.0 in | Wt 127.0 lb

## 2020-01-30 DIAGNOSIS — Z302 Encounter for sterilization: Secondary | ICD-10-CM

## 2020-01-30 NOTE — Progress Notes (Signed)
PRE-OPERATIVE HISTORY AND PHYSICAL EXAM  HPI:  Olivia Sandoval is a 39 y.o. [id number] Patient's last menstrual period was 02/18/2019 (within days).; she is being admitted for surgery related to requested sterilization.  PMHx: Past Medical History:  Diagnosis Date  . Abortion    Past Surgical History:  Procedure Laterality Date  . NO PAST SURGERIES     History reviewed. No pertinent family history. Social History   Tobacco Use  . Smoking status: Current Every Day Smoker    Types: Cigarettes  . Smokeless tobacco: Never Used  Substance Use Topics  . Alcohol use: Not Currently  . Drug use: Not Currently    Current Outpatient Medications:  .  Butalbital-APAP-Caffeine 50-325-40 MG capsule, Take 1-2 capsules by mouth every 6 (six) hours as needed for headache., Disp: 30 capsule, Rfl: 2 .  ibuprofen (ADVIL) 600 MG tablet, Take 1 tablet (600 mg total) by mouth every 6 (six) hours as needed for moderate pain or cramping., Disp: 30 tablet, Rfl: 0 .  medroxyPROGESTERone (DEPO-PROVERA) 150 MG/ML injection, Inject 150 mg into the muscle every 3 (three) months., Disp: , Rfl:  Allergies: Penicillins  Review of Systems  All other systems reviewed and are negative.   Objective: BP 130/80   Ht 5' (1.524 m)   Wt 127 lb (57.6 kg)   LMP 02/18/2019 (Within Days)   BMI 24.80 kg/m   Filed Weights   01/30/20 1340  Weight: 127 lb (57.6 kg)   Physical Exam Constitutional:      General: She is not in acute distress.    Appearance: She is well-developed and well-nourished.  Genitourinary:  Breasts:     Right: No mass, skin change or tenderness.     Left: No mass, skin change or tenderness.    HENT:     Head: Normocephalic and atraumatic. No laceration.     Right Ear: Hearing normal.     Left Ear: Hearing normal.     Nose: No epistaxis or foreign body.     Mouth/Throat:     Mouth: Oropharynx is clear and moist and mucous membranes are normal.     Pharynx: Uvula midline.  Eyes:      Pupils: Pupils are equal, round, and reactive to light.  Neck:     Thyroid: No thyromegaly.  Cardiovascular:     Rate and Rhythm: Normal rate and regular rhythm.     Heart sounds: No murmur heard. No friction rub. No gallop.   Pulmonary:     Effort: Pulmonary effort is normal. No respiratory distress.     Breath sounds: Normal breath sounds. No wheezing.  Abdominal:     General: Bowel sounds are normal. There is no distension.     Palpations: Abdomen is soft.     Tenderness: There is no abdominal tenderness. There is no rebound.  Musculoskeletal:        General: Normal range of motion.     Cervical back: Normal range of motion and neck supple.  Neurological:     Mental Status: She is alert and oriented to person, place, and time.     Cranial Nerves: No cranial nerve deficit.  Skin:    General: Skin is warm and dry.  Psychiatric:        Mood and Affect: Mood and affect normal.        Judgment: Judgment normal.  Vitals reviewed.     Assessment: 1. Admission for sterilization   The patient has been fully  informed about all methods of contraception, both temporary and permanent. She understands that tubal ligation is meant to be permanent, absolute and irreversible. She was told that there is an approximately 1 in 400 chance of a pregnancy in the future after tubal ligation. She was told the short and long term complications of tubal ligation. She understands the risks from this surgery include, but are not limited to, the risks of anesthesia, hemorrhage, infection, perforation, and injury to adjacent structures, bowel, bladder and blood vessels.   Paul Andrius Andrepont, MD, FACOG [facility], [facility] 01/30/2020  1:44 PM  

## 2020-01-30 NOTE — Patient Instructions (Signed)
Laparoscopic Tubal Ligation, Care After This sheet gives you information about how to care for yourself after your procedure. Your health care provider may also give you more specific instructions. If you have problems or questions, contact your health care provider. What can I expect after the procedure? After the procedure, it is common to have:  A sore throat.  Discomfort in your shoulder.  Mild discomfort or cramping in your abdomen.  Gas pains.  Pain or soreness in the area where the surgical incision was made.  A bloated feeling.  Tiredness.  Nausea.  Vomiting. Follow these instructions at home: Medicines  Take over-the-counter and prescription medicines only as told by your health care provider.  Do not take aspirin because it can cause bleeding.  Ask your health care provider if the medicine prescribed to you: ? Requires you to avoid driving or using heavy machinery. ? Can cause constipation. You may need to take actions to prevent or treat constipation, such as:  Drink enough fluid to keep your urine pale yellow.  Take over-the-counter or prescription medicines.  Eat foods that are high in fiber, such as beans, whole grains, and fresh fruits and vegetables.  Limit foods that are high in fat and processed sugars, such as fried or sweet foods. Incision care      Follow instructions from your health care provider about how to take care of your incision. Make sure you: ? Wash your hands with soap and water before and after you change your bandage (dressing). If soap and water are not available, use hand sanitizer. ? Change your dressing as told by your health care provider. ? Leave stitches (sutures), skin glue, or adhesive strips in place. These skin closures may need to stay in place for 2 weeks or longer. If adhesive strip edges start to loosen and curl up, you may trim the loose edges. Do not remove adhesive strips completely unless your health care provider  tells you to do that.  Check your incision area every day for signs of infection. Check for: ? Redness, swelling, or pain. ? Fluid or blood. ? Warmth. ? Pus or a bad smell. Activity  Rest as told by your health care provider.  Avoid sitting for a long time without moving. Get up to take short walks every 1-2 hours. This is important to improve blood flow and breathing. Ask for help if you feel weak or unsteady.  Return to your normal activities as told by your health care provider. Ask your health care provider what activities are safe for you. General instructions  Do not take baths, swim, or use a hot tub until your health care provider approves. Ask your health care provider if you may take showers. You may only be allowed to take sponge baths.  Have someone help you with your daily household tasks for the first few days.  Keep all follow-up visits as told by your health care provider. This is important. Contact a health care provider if:  You have redness, swelling, or pain around your incision.  Your incision feels warm to the touch.  You have pus or a bad smell coming from your incision.  The edges of your incision break open after the sutures have been removed.  Your pain does not improve after 2-3 days.  You have a rash.  You repeatedly become dizzy or light-headed.  Your pain medicine is not helping. Get help right away if you:  Have a fever.  Faint.  Have increasing   pain in your abdomen.  Have severe pain in one or both of your shoulders.  Have fluid or blood coming from your sutures or from your vagina.  Have shortness of breath or difficulty breathing.  Have chest pain or leg pain.  Have ongoing nausea, vomiting, or diarrhea. Summary  After the procedure, it is common to have mild discomfort or cramping in your abdomen.  Take over-the-counter and prescription medicines only as told by your health care provider.  Watch for symptoms that should  prompt you to call your health care provider.  Keep all follow-up visits as told by your health care provider. This is important. This information is not intended to replace advice given to you by your health care provider. Make sure you discuss any questions you have with your health care provider. Document Revised: 07/11/2018 Document Reviewed: 12/27/2017 Elsevier Patient Education  2020 Elsevier Inc.  

## 2020-01-30 NOTE — H&P (View-Only) (Signed)
PRE-OPERATIVE HISTORY AND PHYSICAL EXAM  HPI:  Olivia Sandoval is a 39 y.o. [id number] Patient's last menstrual period was 02/18/2019 (within days).; she is being admitted for surgery related to requested sterilization.  PMHx: Past Medical History:  Diagnosis Date  . Abortion    Past Surgical History:  Procedure Laterality Date  . NO PAST SURGERIES     History reviewed. No pertinent family history. Social History   Tobacco Use  . Smoking status: Current Every Day Smoker    Types: Cigarettes  . Smokeless tobacco: Never Used  Substance Use Topics  . Alcohol use: Not Currently  . Drug use: Not Currently    Current Outpatient Medications:  .  Butalbital-APAP-Caffeine 50-325-40 MG capsule, Take 1-2 capsules by mouth every 6 (six) hours as needed for headache., Disp: 30 capsule, Rfl: 2 .  ibuprofen (ADVIL) 600 MG tablet, Take 1 tablet (600 mg total) by mouth every 6 (six) hours as needed for moderate pain or cramping., Disp: 30 tablet, Rfl: 0 .  medroxyPROGESTERone (DEPO-PROVERA) 150 MG/ML injection, Inject 150 mg into the muscle every 3 (three) months., Disp: , Rfl:  Allergies: Penicillins  Review of Systems  All other systems reviewed and are negative.   Objective: BP 130/80   Ht 5' (1.524 m)   Wt 127 lb (57.6 kg)   LMP 02/18/2019 (Within Days)   BMI 24.80 kg/m   Filed Weights   01/30/20 1340  Weight: 127 lb (57.6 kg)   Physical Exam Constitutional:      General: She is not in acute distress.    Appearance: She is well-developed and well-nourished.  Genitourinary:  Breasts:     Right: No mass, skin change or tenderness.     Left: No mass, skin change or tenderness.    HENT:     Head: Normocephalic and atraumatic. No laceration.     Right Ear: Hearing normal.     Left Ear: Hearing normal.     Nose: No epistaxis or foreign body.     Mouth/Throat:     Mouth: Oropharynx is clear and moist and mucous membranes are normal.     Pharynx: Uvula midline.  Eyes:      Pupils: Pupils are equal, round, and reactive to light.  Neck:     Thyroid: No thyromegaly.  Cardiovascular:     Rate and Rhythm: Normal rate and regular rhythm.     Heart sounds: No murmur heard. No friction rub. No gallop.   Pulmonary:     Effort: Pulmonary effort is normal. No respiratory distress.     Breath sounds: Normal breath sounds. No wheezing.  Abdominal:     General: Bowel sounds are normal. There is no distension.     Palpations: Abdomen is soft.     Tenderness: There is no abdominal tenderness. There is no rebound.  Musculoskeletal:        General: Normal range of motion.     Cervical back: Normal range of motion and neck supple.  Neurological:     Mental Status: She is alert and oriented to person, place, and time.     Cranial Nerves: No cranial nerve deficit.  Skin:    General: Skin is warm and dry.  Psychiatric:        Mood and Affect: Mood and affect normal.        Judgment: Judgment normal.  Vitals reviewed.     Assessment: 1. Admission for sterilization   The patient has been fully  informed about all methods of contraception, both temporary and permanent. She understands that tubal ligation is meant to be permanent, absolute and irreversible. She was told that there is an approximately 1 in 400 chance of a pregnancy in the future after tubal ligation. She was told the short and long term complications of tubal ligation. She understands the risks from this surgery include, but are not limited to, the risks of anesthesia, hemorrhage, infection, perforation, and injury to adjacent structures, bowel, bladder and blood vessels.   Annamarie Major, MD, [facility], [facility] 01/30/2020  1:44 PM

## 2020-01-31 ENCOUNTER — Encounter
Admission: RE | Admit: 2020-01-31 | Discharge: 2020-01-31 | Disposition: A | Discharge status: Home / Self Care | Payer: Medicaid Other | Source: Ambulatory Visit | Attending: Obstetrics & Gynecology | Admitting: Obstetrics & Gynecology

## 2020-01-31 HISTORY — DX: Anemia, unspecified: D64.9

## 2020-01-31 HISTORY — DX: Headache, unspecified: R51.9

## 2020-01-31 NOTE — Patient Instructions (Signed)
Your procedure is scheduled on:02-07-20 THURSDAY Report to the Registration Desk on the 1st floor of the [facility]-Then proceed to the 2nd floor Surgery Desk in the Medical [facility] To find out your arrival time, please call [phone] between 1PM - 3PM on:02-06-20 WEDNESDAY  REMEMBER: Instructions that are not followed completely may result in serious medical risk, up to and including death; or upon the discretion of your surgeon and anesthesiologist your surgery may need to be rescheduled.  Do not eat food after midnight the night before surgery.  No gum chewing, lozengers or hard candies.  You may however, drink CLEAR liquids up to 2 hours before you are scheduled to arrive for your surgery. Do not drink anything within 2 hours of your scheduled arrival time.  Clear liquids include: - water  - apple juice without pulp - gatorade  - black coffee or tea (Do NOT add milk or creamers to the coffee or tea) Do NOT drink anything that is not on this list.  TAKE THESE MEDICATIONS THE MORNING OF SURGERY WITH A SIP OF WATER: -NONE   One week prior to surgery: Stop Anti-inflammatories (NSAIDS) such as Advil, Aleve, Ibuprofen, Motrin, Naproxen, Naprosyn and Aspirin based products such as Excedrin, Goodys Powder, BC Powder-OK TO TAKE TYLENOL IF NEEDED  Stop ANY OVER THE COUNTER supplements until after surgery.  No Alcohol for 24 hours before or after surgery.  No Smoking including e-cigarettes for 24 hours prior to surgery.  No chewable tobacco products for at least 6 hours prior to surgery.  No nicotine patches on the day of surgery.  Do not use any "recreational" drugs for at least a week prior to your surgery.  Please be advised that the combination of cocaine and anesthesia may have negative outcomes, up to and including death. If you test positive for cocaine, your surgery will be cancelled.  On the morning of surgery brush your teeth with toothpaste and water, you may rinse your  mouth with mouthwash if you wish. Do not swallow any toothpaste or mouthwash.  Do not wear jewelry, make-up, hairpins, clips or nail polish.  Do not wear lotions, powders, or perfumes.   Do not shave body from the neck down 48 hours prior to surgery just in case you cut yourself which could leave a site for infection.  Also, freshly shaved skin may become irritated if using the CHG soap.  Contact lenses, hearing aids and dentures may not be worn into surgery.  Do not bring valuables to the hospital. [facility] is not responsible for any missing/lost belongings or valuables.   Use CHG Soap as directed on instruction sheet.  Notify your doctor if there is any change in your medical condition (cold, fever, infection).  Wear comfortable clothing (specific to your surgery type) to the hospital.  Plan for stool softeners for home use; pain medications have a tendency to cause constipation. You can also help prevent constipation by eating foods high in fiber such as fruits and vegetables and drinking plenty of fluids as your diet allows.  After surgery, you can help prevent lung complications by doing breathing exercises.  Take deep breaths and cough every 1-2 hours. Your doctor may order a device called an Incentive Spirometer to help you take deep breaths. When coughing or sneezing, hold a pillow firmly against your incision with both hands. This is called "splinting." Doing this helps protect your incision. It also decreases belly discomfort.  If you are being admitted to the hospital  overnight, leave your suitcase in the car. After surgery it may be brought to your room.  If you are being discharged the day of surgery, you will not be allowed to drive home. You will need a responsible adult (18 years or older) to drive you home and stay with you that night.   If you are taking public transportation, you will need to have a responsible adult (18 years or older) with you. Please confirm  with your physician that it is acceptable to use public transportation.   Please call the Pre-admissions Testing Dept. at [phone] if you have any questions about these instructions.  Visitation Policy:  Patients undergoing a surgery or procedure may have one family member or support person with them as long as that person is not COVID-19 positive or experiencing its symptoms.  That person may remain in the waiting area during the procedure.  Inpatient Visitation Update:   In an effort to ensure the safety of our team members and our patients, we are implementing a change to our visitation policy:  Effective Monday, Aug. 9, at 7 a.m., inpatients will be allowed one support person.  o The support person may change daily.  o The support person must pass our screening, gel in and out, and wear a mask at all times, including in the patient's room.  o Patients must also wear a mask when staff or their support person are in the room.  o Masking is required regardless of vaccination status.  Systemwide, no visitors 17 or younger.

## 2020-02-05 ENCOUNTER — Other Ambulatory Visit: Payer: Medicaid Other

## 2020-02-05 ENCOUNTER — Encounter
Admission: RE | Admit: 2020-02-05 | Discharge: 2020-02-05 | Disposition: A | Discharge status: Home / Self Care | Payer: Medicaid Other | Source: Ambulatory Visit | Attending: Obstetrics & Gynecology | Admitting: Obstetrics & Gynecology

## 2020-02-05 ENCOUNTER — Other Ambulatory Visit: Payer: Self-pay

## 2020-02-05 DIAGNOSIS — Z01812 Encounter for preprocedural laboratory examination: Secondary | ICD-10-CM | POA: Diagnosis not present

## 2020-02-05 DIAGNOSIS — Z20822 Contact with and (suspected) exposure to covid-19: Secondary | ICD-10-CM | POA: Diagnosis not present

## 2020-02-05 LAB — CBC
HCT: 41.7 % (ref 36.0–46.0)
Hemoglobin: 13.7 g/dL (ref 12.0–15.0)
MCH: 31.7 pg (ref 26.0–34.0)
MCHC: 32.9 g/dL (ref 30.0–36.0)
MCV: 96.5 fL (ref 80.0–100.0)
Platelets: 367 10*3/uL (ref 150–400)
RBC: 4.32 MIL/uL (ref 3.87–5.11)
RDW: 13.2 % (ref 11.5–15.5)
WBC: 3.9 10*3/uL — ABNORMAL LOW (ref 4.0–10.5)
nRBC: 0 % (ref 0.0–0.2)

## 2020-02-05 LAB — SARS CORONAVIRUS 2 (TAT 6-24 HRS): SARS Coronavirus 2: NEGATIVE

## 2020-02-05 LAB — TYPE AND SCREEN
ABO/RH(D): O POS
Antibody Screen: NEGATIVE
Extend sample reason: UNDETERMINED

## 2020-02-07 ENCOUNTER — Ambulatory Visit
Admission: RE | Admit: 2020-02-07 | Discharge: 2020-02-07 | Disposition: A | Discharge status: Home / Self Care | Payer: Medicaid Other | Attending: Obstetrics & Gynecology | Admitting: Obstetrics & Gynecology

## 2020-02-07 ENCOUNTER — Ambulatory Visit: Payer: Medicaid Other | Admitting: Anesthesiology

## 2020-02-07 ENCOUNTER — Encounter
Admission: RE | Disposition: A | Discharge status: Home / Self Care | Payer: Self-pay | Source: Home / Self Care | Attending: Obstetrics & Gynecology

## 2020-02-07 ENCOUNTER — Other Ambulatory Visit: Payer: Self-pay

## 2020-02-07 ENCOUNTER — Encounter: Payer: Self-pay | Admitting: Obstetrics & Gynecology

## 2020-02-07 DIAGNOSIS — F17210 Nicotine dependence, cigarettes, uncomplicated: Secondary | ICD-10-CM | POA: Insufficient documentation

## 2020-02-07 DIAGNOSIS — Z793 Long term (current) use of hormonal contraceptives: Secondary | ICD-10-CM | POA: Diagnosis not present

## 2020-02-07 DIAGNOSIS — Z880 Allergy status to penicillin: Secondary | ICD-10-CM | POA: Insufficient documentation

## 2020-02-07 DIAGNOSIS — Z302 Encounter for sterilization: Secondary | ICD-10-CM

## 2020-02-07 HISTORY — PX: LAPAROSCOPIC TUBAL LIGATION: SHX1937

## 2020-02-07 LAB — POCT PREGNANCY, URINE: Preg Test, Ur: NEGATIVE

## 2020-02-07 SURGERY — LIGATION, FALLOPIAN TUBE, LAPAROSCOPIC
Anesthesia: General | Laterality: Bilateral

## 2020-02-07 MED ORDER — FENTANYL CITRATE (PF) 100 MCG/2ML IJ SOLN: 25.0000 ug | INTRAMUSCULAR | Status: DC | PRN

## 2020-02-07 MED ORDER — DROPERIDOL 2.5 MG/ML IJ SOLN
0.6250 mg | Freq: Once | INTRAMUSCULAR | Status: DC | PRN
  Filled 2020-02-07: qty 2

## 2020-02-07 MED ORDER — ACETAMINOPHEN 160 MG/5ML PO SOLN
325.0000 mg | ORAL | Status: DC | PRN
  Filled 2020-02-07: qty 20.3

## 2020-02-07 MED ORDER — FAMOTIDINE 20 MG PO TABS: 20.0000 mg | ORAL_TABLET | Freq: Once | ORAL | Status: DC

## 2020-02-07 MED ORDER — ACETAMINOPHEN 325 MG PO TABS: 325.0000 mg | ORAL_TABLET | ORAL | Status: DC | PRN

## 2020-02-07 MED ORDER — HYDROCODONE-ACETAMINOPHEN 7.5-325 MG PO TABS: 1.0000 | ORAL_TABLET | Freq: Once | ORAL | Status: DC | PRN

## 2020-02-07 MED ORDER — ROCURONIUM BROMIDE 100 MG/10ML IV SOLN
INTRAVENOUS | Status: DC | PRN
  Administered 2020-02-07: 50 mg via INTRAVENOUS

## 2020-02-07 MED ORDER — LACTATED RINGERS IV SOLN: INTRAVENOUS | Status: DC

## 2020-02-07 MED ORDER — KETOROLAC TROMETHAMINE 30 MG/ML IJ SOLN: 30.0000 mg | Freq: Once | INTRAMUSCULAR | Status: DC | PRN

## 2020-02-07 MED ORDER — ACETAMINOPHEN 650 MG RE SUPP
650.0000 mg | RECTAL | Status: DC | PRN
  Filled 2020-02-07: qty 1

## 2020-02-07 MED ORDER — FENTANYL CITRATE (PF) 100 MCG/2ML IJ SOLN
INTRAMUSCULAR | Status: DC | PRN
  Administered 2020-02-07: 100 ug via INTRAVENOUS

## 2020-02-07 MED ORDER — MORPHINE SULFATE (PF) 2 MG/ML IV SOLN: 1.0000 mg | INTRAVENOUS | Status: DC | PRN

## 2020-02-07 MED ORDER — MIDAZOLAM HCL 2 MG/2ML IJ SOLN
INTRAMUSCULAR | Status: AC
  Filled 2020-02-07: qty 2

## 2020-02-07 MED ORDER — ACETAMINOPHEN 10 MG/ML IV SOLN
INTRAVENOUS | Status: DC | PRN
  Administered 2020-02-07: 1000 mg via INTRAVENOUS

## 2020-02-07 MED ORDER — POVIDONE-IODINE 10 % EX SWAB: 2.0000 "application " | Freq: Once | CUTANEOUS | Status: DC

## 2020-02-07 MED ORDER — ONDANSETRON HCL 4 MG/2ML IJ SOLN
INTRAMUSCULAR | Status: DC | PRN
  Administered 2020-02-07: 4 mg via INTRAVENOUS

## 2020-02-07 MED ORDER — PROPOFOL 10 MG/ML IV BOLUS
INTRAVENOUS | Status: AC
  Filled 2020-02-07: qty 20

## 2020-02-07 MED ORDER — PROMETHAZINE HCL 25 MG/ML IJ SOLN: 6.2500 mg | INTRAMUSCULAR | Status: DC | PRN

## 2020-02-07 MED ORDER — CHLORHEXIDINE GLUCONATE 0.12 % MT SOLN: 15.0000 mL | Freq: Once | OROMUCOSAL | Status: DC

## 2020-02-07 MED ORDER — FENTANYL CITRATE (PF) 100 MCG/2ML IJ SOLN
INTRAMUSCULAR | Status: AC
  Filled 2020-02-07: qty 2

## 2020-02-07 MED ORDER — SUGAMMADEX SODIUM 200 MG/2ML IV SOLN
INTRAVENOUS | Status: DC | PRN
  Administered 2020-02-07: 200 mg via INTRAVENOUS

## 2020-02-07 MED ORDER — ACETAMINOPHEN 325 MG PO TABS: 650.0000 mg | ORAL_TABLET | ORAL | Status: DC | PRN

## 2020-02-07 MED ORDER — DEXAMETHASONE SODIUM PHOSPHATE 10 MG/ML IJ SOLN
INTRAMUSCULAR | Status: DC | PRN
  Administered 2020-02-07: 10 mg via INTRAVENOUS

## 2020-02-07 MED ORDER — LIDOCAINE HCL (CARDIAC) PF 100 MG/5ML IV SOSY
PREFILLED_SYRINGE | INTRAVENOUS | Status: DC | PRN
  Administered 2020-02-07: 60 mg via INTRAVENOUS

## 2020-02-07 MED ORDER — OXYCODONE-ACETAMINOPHEN 5-325 MG PO TABS: 1.0000 | ORAL_TABLET | ORAL | 0 refills | Status: DC | PRN

## 2020-02-07 MED ORDER — BUPIVACAINE HCL 0.5 % IJ SOLN
INTRAMUSCULAR | Status: DC | PRN
  Administered 2020-02-07: 7 mL

## 2020-02-07 MED ORDER — PROPOFOL 10 MG/ML IV BOLUS
INTRAVENOUS | Status: DC | PRN
  Administered 2020-02-07: 150 mg via INTRAVENOUS

## 2020-02-07 MED ORDER — ORAL CARE MOUTH RINSE: 15.0000 mL | Freq: Once | OROMUCOSAL | Status: DC

## 2020-02-07 MED ORDER — MIDAZOLAM HCL 2 MG/2ML IJ SOLN
INTRAMUSCULAR | Status: DC | PRN
  Administered 2020-02-07: 2 mg via INTRAVENOUS

## 2020-02-07 MED ORDER — MEPERIDINE HCL 50 MG/ML IJ SOLN: 6.2500 mg | INTRAMUSCULAR | Status: DC | PRN

## 2020-02-07 MED ORDER — OXYCODONE-ACETAMINOPHEN 5-325 MG PO TABS: 1.0000 | ORAL_TABLET | ORAL | Status: DC | PRN

## 2020-02-07 MED ORDER — KETOROLAC TROMETHAMINE 30 MG/ML IJ SOLN
INTRAMUSCULAR | Status: DC | PRN
  Administered 2020-02-07: 30 mg via INTRAVENOUS

## 2020-02-07 SURGICAL SUPPLY — 35 items
ADH SKN CLS APL DERMABOND .7 (GAUZE/BANDAGES/DRESSINGS) ×1
APL PRP STRL LF DISP 70% ISPRP (MISCELLANEOUS) ×1
BAG COUNTER SPONGE EZ (MISCELLANEOUS) ×2 IMPLANT
BAG SPNG 4X4 CLR HAZ (MISCELLANEOUS) ×1
BLADE SURG SZ11 CARB STEEL (BLADE) ×3 IMPLANT
CANISTER SUCT 1200ML W/VALVE (MISCELLANEOUS) ×3 IMPLANT
CATH ROBINSON RED A/P 16FR (CATHETERS) ×3 IMPLANT
CHLORAPREP W/TINT 26 (MISCELLANEOUS) ×3 IMPLANT
COUNTER SPONGE BAG EZ (MISCELLANEOUS) ×1
COVER WAND RF STERILE (DRAPES) IMPLANT
DERMABOND ADVANCED (GAUZE/BANDAGES/DRESSINGS) ×2
DERMABOND ADVANCED .7 DNX12 (GAUZE/BANDAGES/DRESSINGS) ×1 IMPLANT
DRSG TEGADERM 2-3/8X2-3/4 SM (GAUZE/BANDAGES/DRESSINGS) ×6 IMPLANT
GLOVE BIO SURGEON STRL SZ8 (GLOVE) ×3 IMPLANT
GLOVE INDICATOR 8.0 STRL GRN (GLOVE) ×3 IMPLANT
GOWN STRL REUS W/ TWL LRG LVL3 (GOWN DISPOSABLE) ×1 IMPLANT
GOWN STRL REUS W/ TWL XL LVL3 (GOWN DISPOSABLE) ×1 IMPLANT
GOWN STRL REUS W/TWL LRG LVL3 (GOWN DISPOSABLE) ×3
GOWN STRL REUS W/TWL XL LVL3 (GOWN DISPOSABLE) ×3
LABEL OR SOLS (LABEL) ×3 IMPLANT
MANIFOLD NEPTUNE II (INSTRUMENTS) ×3 IMPLANT
NEEDLE VERESS 14GA 120MM (NEEDLE) ×3 IMPLANT
NS IRRIG 500ML POUR BTL (IV SOLUTION) ×3 IMPLANT
PACK GYN LAPAROSCOPIC (MISCELLANEOUS) ×3 IMPLANT
PAD OB MATERNITY 4.3X12.25 (PERSONAL CARE ITEMS) ×3 IMPLANT
PAD PREP 24X41 OB/GYN DISP (PERSONAL CARE ITEMS) ×3 IMPLANT
SET TUBE SMOKE EVAC HIGH FLOW (TUBING) ×3 IMPLANT
SHEARS HARMONIC ACE PLUS 36CM (ENDOMECHANICALS) IMPLANT
SPONGE GAUZE 2X2 8PLY STER LF (GAUZE/BANDAGES/DRESSINGS) ×2
SPONGE GAUZE 2X2 8PLY STRL LF (GAUZE/BANDAGES/DRESSINGS) ×4 IMPLANT
SUT VIC AB 2-0 UR6 27 (SUTURE) ×3 IMPLANT
SUT VIC AB 4-0 PS2 18 (SUTURE) ×6 IMPLANT
SYR 10ML LL (SYRINGE) ×6 IMPLANT
TROCAR ENDO BLADELESS 11MM (ENDOMECHANICALS) IMPLANT
TROCAR XCEL NON-BLD 5MMX100MML (ENDOMECHANICALS) ×3 IMPLANT

## 2020-02-07 NOTE — Anesthesia Procedure Notes (Signed)
Procedure Name: Intubation Date/Time: 02/07/2020 10:04 AM Performed by: Lorie Apley, CRNA Pre-anesthesia Checklist: Patient identified, Patient being monitored, Timeout performed, Emergency Drugs available and Suction available Patient Re-evaluated:Patient Re-evaluated prior to induction Oxygen Delivery Method: Circle system utilized Preoxygenation: Pre-oxygenation with 100% oxygen Induction Type: IV induction Ventilation: Mask ventilation without difficulty Laryngoscope Size: Mac and 3 Grade View: Grade I Tube type: Oral Tube size: 6.5 mm Number of attempts: 1 Airway Equipment and Method: Stylet Placement Confirmation: ETT inserted through vocal cords under direct vision,  positive ETCO2 and breath sounds checked- equal and bilateral Secured at: 21 cm Tube secured with: Tape Dental Injury: Teeth and Oropharynx as per pre-operative assessment

## 2020-02-07 NOTE — Discharge Instructions (Addendum)
Salpingectomy, Care After This sheet gives you information about how to care for yourself after your procedure. Your health care provider may also give you more specific instructions. If you have problems or questions, contact your health care provider. What can I expect after the procedure? After the procedure, it is common to have:  Pain in your abdomen.  Some light vaginal bleeding (spotting) for a few days.  Tiredness. Your recovery time will vary depending on which method your surgeon used for your surgery. Follow these instructions at home: Incision care   Follow instructions from your health care provider about how to take care of your incisions. Make sure you: ? Wash your hands with soap and water before and after you change your bandage (dressing). If soap and water are not available, use hand sanitizer. ? Change or remove your dressing as told by your health care provider. ? Leave any stitches (sutures), skin glue, or adhesive strips in place. These skin closures may need to stay in place for 2 weeks or longer. If adhesive strip edges start to loosen and curl up, you may trim the loose edges. Do not remove adhesive strips completely unless your health care provider tells you to do that.  Keep your dressing clean and dry.  Check your incision area every day for signs of infection. Check for: ? Redness, swelling, or pain that gets worse. ? Fluid or blood. ? Warmth. ? Pus or a bad smell. Activity  Rest as told by your health care provider.  Avoid sitting for a long time without moving. Get up to take short walks every 1-2 hours. This is important to improve blood flow and breathing. Ask for help if you feel weak or unsteady.  Return to your normal activities as told by your health care provider. Ask your health care provider what activities are safe for you.  Do not drive until your health care provider says that it is safe.  Do not lift anything that is heavier than 10 lb  (4.5 kg), or the limit that you are told, until your health care provider says that it is safe. This may be 2-6 weeks depending on your surgery.  Until your health care provider approves: ? Do not douche. ? Do not use tampons. ? Do not have sex. Medicines  Take over-the-counter and prescription medicines only as told by your health care provider.  Ask your health care provider if the medicine prescribed to you: ? Requires you to avoid driving or using heavy machinery. ? Can cause constipation. You may need to take actions to prevent or treat constipation, such as:  Drink enough fluid to keep your urine pale yellow.  Take over-the-counter or prescription medicines.  Eat foods that are high in fiber, such as beans, whole grains, and fresh fruits and vegetables.  Limit foods that are high in fat and processed sugars, such as fried or sweet foods. General instructions  Wear compression stockings as told by your health care provider. These stockings help to prevent blood clots and reduce swelling in your legs.  Do not use any products that contain nicotine or tobacco, such as cigarettes, e-cigarettes, and chewing tobacco. If you need help quitting, ask your health care provider.  Do not take baths, swim, or use a hot tub until your health care provider approves. You may take showers.  Keep all follow-up visits as told by your health care provider. This is important. Contact a health care provider if you have:  Pain  when you urinate.  Redness, swelling, or pain around an incision.  Fluid or blood coming from an incision.  Pus or a bad smell coming from an incision.  An incision that feels warm to the touch.  A fever.  Abdominal pain that gets worse or does not get better with medicine.  An incision that starts to break open.  A rash.  Light-headedness.  Nausea and vomiting. Get help right away if you:  Have pain in your chest or leg.  Develop shortness of  breath.  Faint.  Have increased or heavy vaginal bleeding, such as soaking a pad in an hour. Summary  After the procedure, it is common to feel tired, have some pain in your abdomen, and have some light vaginal bleeding for a few days.  Follow instructions from your health care provider about how to take care of your incisions.  Return to your normal activities as told by your health care provider. Ask your health care provider what activities are safe for you.  Do not douche, use tampons, or have sex until your health care provider approves.  Keep all follow-up visits as told by your health care provider. This information is not intended to replace advice given to you by your health care provider. Make sure you discuss any questions you have with your health care provider. Document Revised: 01/23/2018 Document Reviewed: 01/23/2018 Elsevier Patient Education  2020 [employer]   AMBULATORY SURGERY  DISCHARGE INSTRUCTIONS   1) The drugs that you were given will stay in your system until tomorrow so for the next 24 hours you should not:  A) Drive an automobile B) Make any legal decisions C) Drink any alcoholic beverage   2) You may resume regular meals tomorrow.  Today it is better to start with liquids and gradually work up to solid foods.  You may eat anything you prefer, but it is better to start with liquids, then soup and crackers, and gradually work up to solid foods.   3) Please notify your doctor immediately if you have any unusual bleeding, trouble breathing, redness and pain at the surgery site, drainage, fever, or pain not relieved by medication.    4) Additional Instructions:        Please contact your physician with any problems or Same Day Surgery at [phone], Monday through Friday 6 am to 4 pm, or [place] at [facility] number at [phone].

## 2020-02-07 NOTE — Progress Notes (Signed)
[facility] [id number] [place] [address] Phone: [phone]  February 07, 2020  Patient: Olivia Sandoval  Date of Birth: 04-13-1980  Date of Visit: 01/08/2020    To Whom It May Concern:  Jupiter Boys was seen and treated in the Surgery Department at [facility] on 01/08/2020. ARMANI GAWLIK  may return to work on 02/11/2020.  Sincerely,  Annamarie Major, MD [facility]

## 2020-02-07 NOTE — Interval H&P Note (Signed)
History and Physical Interval Note:  02/07/2020 9:14 AM  Olivia Sandoval  has presented today for surgery, with the diagnosis of Sterilization Z30.09.  The various methods of treatment have been discussed with the patient and family. After consideration of risks, benefits and other options for treatment, the patient has consented to  Procedure(s): LAPAROSCOPIC TUBAL LIGATION (Bilateral) as a surgical intervention.  The patient's history has been reviewed, patient examined, no change in status, stable for surgery.  I have reviewed the patient's chart and labs.  Questions were answered to the patient's satisfaction.     Letitia Libra

## 2020-02-07 NOTE — Op Note (Signed)
°  Operative Note   02/07/2020  PRE-OP DIAGNOSIS: Desire for permanent sterilization  POST-OP DIAGNOSIS: same   PROCEDURE: Procedure(s): LAPAROSCOPIC TUBAL LIGATION by PARTIAL BILATERAL SALPINGECTOMY  SURGEON: Annamarie Major, MD, FACOG  ANESTHESIA: Choice   ESTIMATED BLOOD LOSS: 20 mL  COMPLICATIONS: None  DISPOSITION: PACU - hemodynamically stable.  CONDITION: stable  FINDINGS: Laparoscopic survey of the abdomen revealed a grossly normal uterus, tubes, ovaries, liver edge, gallbladder edge and appendix, No intra-abdominal adhesions were noted.  PROCEDURE IN DETAIL: The patient was taken to the OR where anesthesia was administed. The patient was positioned in dorsal lithotomy in the Carmine stirrups. The patient was then examined under anesthesia with the above noted findings. The patient was prepped and draped in the normal sterile fashion and bladder was drained using a red rubber cathater. Speculum exam normal, and a sponge stick was placed for manipulation purposes.  Attention was turned to the patients abdomen where a 5 mm skin incision was made in the umbilical fold, after injection of local anesthesia. The Veress step needle was carefully introduced into the peritoneal cavity with placement confirmed using the hanging drop technique.  Pneumoperitoneum was obtained. The 5 mm port was then placed under direct visualization with the operative laparoscope  The above noted findings.  Trendelenburg.  A 5 mm trocar was then placed in the suprapubic region under direct visualization with the laparoscope.  Right and left fallopian tubes are identified and followed out to their fimbria.  Each tube is excised utilizing the Harmonic scapel to include the fibria.  No injuries or bleeding was noted.  All instruments and ports were then removed from the abdomen after gas was expelled and patient was leveled.   The skin was closed with skin adhesive. The patient tolerated the procedure well. All  counts were correct x 2. The patient was transferred to the recovery room awake, alert and breathing independently.  Annamarie Major, MD, [facility], [facility] 02/07/2020  10:43 AM

## 2020-02-07 NOTE — Transfer of Care (Signed)
Immediate Anesthesia Transfer of Care Note  Patient: Olivia Sandoval  Procedure(s) Performed: LAPAROSCOPIC TUBAL LIGATION (Bilateral )  Patient Location: PACU  Anesthesia Type:General  Level of Consciousness: awake  Airway & Oxygen Therapy: Patient Spontanous Breathing and Patient connected to face mask oxygen  Post-op Assessment: Report given to RN and Post -op Vital signs reviewed and stable  Post vital signs: Reviewed and stable  Last Vitals:  Vitals Value Taken Time  BP 137/77 02/07/20 1049  Temp    Pulse 92 02/07/20 1050  Resp 23 02/07/20 1050  SpO2 100 % 02/07/20 1050  Vitals shown include unvalidated device data.  Last Pain:  Vitals:   02/07/20 0933  TempSrc: Oral  PainSc: 0-No pain         Complications: No complications documented.

## 2020-02-07 NOTE — Anesthesia Preprocedure Evaluation (Signed)
Anesthesia Evaluation  Patient identified by MRN, date of birth, ID band Patient awake    Reviewed: Allergy & Precautions, H&P , NPO status , Patient's Chart, lab work & pertinent test results, reviewed documented beta blocker date and time   Airway Mallampati: I  TM Distance: >3 FB Neck ROM: full    Dental  (+) Teeth Intact   Pulmonary neg pulmonary ROS, Current Smoker and Patient abstained from smoking.,    Pulmonary exam normal        Cardiovascular negative cardio ROS Normal cardiovascular exam     Neuro/Psych  Headaches,    GI/Hepatic negative GI ROS, Neg liver ROS, neg GERD  ,  Endo/Other  negative endocrine ROS  Renal/GU negative Renal ROS     Musculoskeletal negative musculoskeletal ROS (+)   Abdominal   Peds  Hematology Anemia resolved   Anesthesia Other Findings Past Medical History: No date: Abortion No date: Anemia     Comment:  H/O No date: Headache Past Surgical History: No date: NO PAST SURGERIES   Reproductive/Obstetrics                             Anesthesia Physical Anesthesia Plan  ASA: II  Anesthesia Plan: General ETT   Post-op Pain Management:    Induction: Intravenous  PONV Risk Score and Plan: 3 and Ondansetron, Treatment may vary due to age or medical condition, Midazolam and Dexamethasone  Airway Management Planned: Oral ETT  Additional Equipment:   Intra-op Plan:   Post-operative Plan: Extubation in OR  Informed Consent: I have reviewed the patients History and Physical, chart, labs and discussed the procedure including the risks, benefits and alternatives for the proposed anesthesia with the patient or authorized representative who has indicated his/her understanding and acceptance.     Dental Advisory Given  Plan Discussed with: CRNA  Anesthesia Plan Comments:         Anesthesia Quick Evaluation

## 2020-02-07 NOTE — Anesthesia Postprocedure Evaluation (Signed)
Anesthesia Post Note  Patient: Olivia Sandoval  Procedure(s) Performed: LAPAROSCOPIC TUBAL LIGATION (Bilateral )  Patient location during evaluation: PACU Anesthesia Type: General Level of consciousness: awake and alert Pain management: pain level controlled Vital Signs Assessment: post-procedure vital signs reviewed and stable Respiratory status: spontaneous breathing, nonlabored ventilation, respiratory function stable and patient connected to nasal cannula oxygen Cardiovascular status: blood pressure returned to baseline and stable Postop Assessment: no apparent nausea or vomiting Anesthetic complications: no   No complications documented.   Last Vitals:  Vitals:   02/07/20 1145 02/07/20 1156  BP:  (!) 141/77  Pulse: 65 66  Resp: 16 16  Temp: (!) 36.1 C 36.7 C  SpO2: 100% 100%    Last Pain:  Vitals:   02/07/20 1156  TempSrc: Temporal  PainSc: 0-No pain                 Corinda Gubler

## 2020-02-11 LAB — SURGICAL PATHOLOGY

## 2020-02-20 ENCOUNTER — Ambulatory Visit (INDEPENDENT_AMBULATORY_CARE_PROVIDER_SITE_OTHER): Payer: Medicaid Other | Admitting: Obstetrics & Gynecology

## 2020-02-20 ENCOUNTER — Encounter: Payer: Self-pay | Admitting: Obstetrics & Gynecology

## 2020-02-20 ENCOUNTER — Other Ambulatory Visit: Payer: Self-pay

## 2020-02-20 VITALS — BP 118/70 | HR 94 | Ht 60.0 in | Wt 127.0 lb

## 2020-02-20 DIAGNOSIS — Z9851 Tubal ligation status: Secondary | ICD-10-CM

## 2020-02-20 MED ORDER — MEDROXYPROGESTERONE ACETATE 10 MG PO TABS: 20.0000 mg | ORAL_TABLET | Freq: Every day | ORAL | 2 refills | Status: AC

## 2020-02-20 NOTE — Progress Notes (Signed)
  Postoperative Follow-up Patient presents post op from laparoscopy and salpingectomy for requested sterilization, 2 weeks ago.  Subjective: Patient reports marked improvement in her preop symptoms. Eating a regular diet without difficulty. The patient is not having any pain.  Activity: normal activities of daily living. Patient reports additional symptom's since surgery (actually since delivery in Oct) of continued bleeding.  Objective: BP 118/70   Pulse 94   Ht 5' (1.524 m)   Wt 127 lb (57.6 kg)   LMP  (Exact Date)   SpO2 99%   Breastfeeding No   BMI 24.80 kg/m  Physical Exam Constitutional:      General: She is not in acute distress.    Appearance: She is well-developed and well-nourished.  Cardiovascular:     Rate and Rhythm: Normal rate.  Pulmonary:     Effort: Pulmonary effort is normal.  Abdominal:     General: There is no distension.     Palpations: Abdomen is soft.     Tenderness: There is no abdominal tenderness.     Comments: Incision Healing Well   Musculoskeletal:        General: Normal range of motion.  Neurological:     Mental Status: She is alert and oriented to person, place, and time.     Cranial Nerves: No cranial nerve deficit.  Skin:    General: Skin is warm and dry.  Psychiatric:        Mood and Affect: Mood and affect normal.     Assessment: s/p :  laparoscopy and tubal ligation progressing well  Plan: Patient has done well after surgery with no apparent complications.  I have discussed the post-operative course to date, and the expected progress moving forward.  The patient understands what complications to be concerned about.  I will see the patient in routine follow up, or sooner if needed.    Activity plan: No restriction.  Provera for AUB [place] if no help  Letitia Libra 02/20/2020, 4:45 PM

## 2020-09-17 ENCOUNTER — Other Ambulatory Visit: Payer: Self-pay

## 2020-09-17 ENCOUNTER — Ambulatory Visit: Payer: Self-pay | Admitting: Physician Assistant

## 2020-09-17 ENCOUNTER — Encounter: Payer: Self-pay | Admitting: Physician Assistant

## 2020-09-17 DIAGNOSIS — Z113 Encounter for screening for infections with a predominantly sexual mode of transmission: Secondary | ICD-10-CM

## 2020-09-17 DIAGNOSIS — B9689 Other specified bacterial agents as the cause of diseases classified elsewhere: Secondary | ICD-10-CM

## 2020-09-17 DIAGNOSIS — A5901 Trichomonal vulvovaginitis: Secondary | ICD-10-CM

## 2020-09-17 DIAGNOSIS — N760 Acute vaginitis: Secondary | ICD-10-CM

## 2020-09-17 LAB — WET PREP FOR TRICH, YEAST, CLUE
Trichomonas Exam: POSITIVE — AB
Yeast Exam: NEGATIVE

## 2020-09-17 MED ORDER — METRONIDAZOLE 500 MG PO TABS: 500.0000 mg | ORAL_TABLET | Freq: Two times a day (BID) | ORAL | 0 refills | Status: AC

## 2020-09-17 NOTE — Progress Notes (Signed)
Pt here for STD screening.  Wet mount results reviewed and medication dispensed per Provider orders.  Condoms given. Berdie Ogren, RN

## 2020-09-17 NOTE — Progress Notes (Signed)
[facility] Department STI clinic/screening visit  Subjective:  Olivia Sandoval is a 40 y.o. female being seen today for an STI screening visit. The patient reports they do have symptoms.  Patient reports that they do not desire a pregnancy in the next year.   They reported they are not interested in discussing contraception today.  No LMP recorded.   Patient has the following medical conditions:   Patient Active Problem List   Diagnosis Date Noted   S/P tubal ligation 02/20/2020   Admission for sterilization 02/07/2020   Pregnancy 11/25/2019   Vaginal delivery 11/25/2019   Supervision of high risk pregnancy in first trimester 05/24/2019   Advanced maternal age in multigravida 05/24/2019    Chief Complaint  Patient presents with   SEXUALLY TRANSMITTED DISEASE    Screening    HPI  Patient reports that she has had an increased amount of vaginal discharge with odor for 1.5 weeks. Denies other symptoms,  chronic conditions,  and regular medicines.  LMP was 09/07/2020 and using BTL as BCM.  States last HIV test was 9 months ago and last pap was 9 months ago.    See flowsheet for further details and programmatic requirements.    The following portions of the patient's history were reviewed and updated as appropriate: allergies, current medications, past medical history, past social history, past surgical history and problem list.  Objective:  There were no vitals filed for this visit.  Physical Exam Constitutional:      General: She is not in acute distress.    Appearance: Normal appearance.  HENT:     Head: Normocephalic and atraumatic.     Comments: No nits,lice, or hair loss. No cervical, supraclavicular or axillary adenopathy.     Mouth/Throat:     Mouth: Mucous membranes are moist.     Pharynx: Oropharynx is clear. No oropharyngeal exudate or posterior oropharyngeal erythema.  Eyes:     Conjunctiva/sclera: Conjunctivae normal.  Pulmonary:     Effort:  Pulmonary effort is normal.  Abdominal:     Palpations: Abdomen is soft. There is no mass.     Tenderness: There is no abdominal tenderness. There is no guarding or rebound.  Genitourinary:    General: Normal vulva.     Rectum: Normal.     Comments: External genitalia/pubic area without nits, lice, edema, erythema, lesions and inguinal adenopathy. Vagina with normal mucosa and moderate amount of thick, greenish discharge. Cervix without visible lesions. Uterus firm, mobile, nt, no masses, no CMT, no adnexal tenderness or fullness.  Musculoskeletal:     Cervical back: Neck supple. No tenderness.  Skin:    General: Skin is warm and dry.     Findings: No bruising, erythema, lesion or rash.  Neurological:     Mental Status: She is alert and oriented to person, place, and time.  Psychiatric:        Mood and Affect: Mood normal.        Behavior: Behavior normal.        Thought Content: Thought content normal.        Judgment: Judgment normal.     Assessment and Plan:  Olivia Sandoval is a 40 y.o. female presenting to the [facility] Department for STI screening  1. Screening for STD (sexually transmitted disease) Patient into clinic with symptoms. Rec condoms with all sex. Await test results.  Counseled that RN will call if needs to RTC for treatment once results are back.  -  WET PREP FOR TRICH, YEAST, CLUE - Chlamydia/Gonorrhea [place] [facility] - HIV [facility] - Syphilis Serology, [facility]  2. BV (bacterial vaginosis) Treat BV and Trich. See below for further instructions. - metroNIDAZOLE (FLAGYL) 500 MG tablet; Take 1 tablet (500 mg total) by mouth 2 (two) times daily for 7 days.  Dispense: 14 tablet; Refill: 0  3. Trichomonal vulvovaginitis Treat Trich and BV with Metronidazole 500 mg #14 1 po BID for 7 days with food, no EtOH for 24 hr before and until 72 hr after completing medicine. No sex for 14 days and until after partner completes treatment. Enc to use  OTC antifungal cream if has itching during or just after antibiotic use.  - metroNIDAZOLE (FLAGYL) 500 MG tablet; Take 1 tablet (500 mg total) by mouth 2 (two) times daily for 7 days.  Dispense: 14 tablet; Refill: 0     No follow-ups on file.  No future appointments.  Matt Holmes, PA

## 2022-12-20 ENCOUNTER — Ambulatory Visit: Payer: Medicaid Other | Admitting: Family Medicine

## 2022-12-20 DIAGNOSIS — Z113 Encounter for screening for infections with a predominantly sexual mode of transmission: Secondary | ICD-10-CM

## 2022-12-20 DIAGNOSIS — A599 Trichomoniasis, unspecified: Secondary | ICD-10-CM

## 2022-12-20 LAB — WET PREP FOR TRICH, YEAST, CLUE
Trichomonas Exam: POSITIVE — AB
Yeast Exam: NEGATIVE

## 2022-12-20 MED ORDER — METRONIDAZOLE 500 MG PO TABS: 500.0000 mg | ORAL_TABLET | Freq: Two times a day (BID) | ORAL | Status: AC

## 2022-12-20 NOTE — Progress Notes (Signed)
Pt is here for STD visit.  Wet prep results reviewed with pt,  treatment required per standing order.  Condoms and 1 contact card given. The patient was dispensed metronidazole today. I provided counseling today regarding the medication. We discussed the medication, the side effects and when to call clinic. Patient given the opportunity to ask questions. Questions answered.    Gaspar Garbe, RN

## 2022-12-20 NOTE — Progress Notes (Signed)
[facility] Department  STI clinic/screening visit [address] [place] [address] [phone]  Subjective:  Olivia Sandoval is a 42 y.o. female being seen today for an STI screening visit. The patient reports they do have symptoms.  Patient reports that they do not desire a pregnancy in the next year.   They reported they are not interested in discussing contraception today.    No LMP recorded.  Patient has the following medical conditions:   Patient Active Problem List   Diagnosis Date Noted   S/P tubal ligation 02/20/2020    Chief Complaint  Patient presents with   SEXUALLY TRANSMITTED DISEASE    HPI  Patient reports to clinic for STI testing- states she has been having discharge x 1 week. Reports her last sex was 1 month ago  Does the patient using douching products? No  Last HIV test per patient/review of record was No results found for: "HMHIVSCREEN"  Lab Results  Component Value Date   HIV Non Reactive 09/14/2019     Last HEPC test per patient/review of record was No results found for: "HMHEPCSCREEN" No components found for: "HEPC"   Last HEPB test per patient/review of record was No components found for: "HMHEPBSCREEN" No components found for: "HEPC"   Patient reports last pap was November 2018- does not qualify for pap testing here d/t hx of tubal ligation  Screening for MPX risk: Does the patient have an unexplained rash? No Is the patient MSM? No Does the patient endorse multiple sex partners or anonymous sex partners? No Did the patient have close or sexual contact with a person diagnosed with MPX? No Has the patient traveled outside the [place] where MPX is endemic? No Is there a high clinical suspicion for MPX-- evidenced by one of the following No  -Unlikely to be chickenpox  -Lymphadenopathy  -Rash that present in same phase of evolution on any given body part See flowsheet for further details and programmatic requirements.    Immunization history:  Immunization History  Administered Date(s) Administered   [employer] Vaccination 11/27/2019, 12/03/2019, 12/27/2019   Pneumococcal Polysaccharide-23 11/26/2019   Tdap 09/14/2019     The following portions of the patient's history were reviewed and updated as appropriate: allergies, current medications, past medical history, past social history, past surgical history and problem list.  Objective:  There were no vitals filed for this visit.  Physical Exam Vitals and nursing note reviewed. Exam conducted with a chaperone present Charlyne Mom).  Constitutional:      Appearance: Normal appearance.  HENT:     Head: Normocephalic and atraumatic.     Mouth/Throat:     Mouth: Mucous membranes are moist.     Pharynx: Oropharynx is clear. No oropharyngeal exudate or posterior oropharyngeal erythema.  Pulmonary:     Effort: Pulmonary effort is normal.  Abdominal:     General: Abdomen is flat.     Palpations: There is no mass.     Tenderness: There is no abdominal tenderness. There is no rebound.  Genitourinary:    General: Normal vulva.     Exam position: Lithotomy position.     Pubic Area: No rash or pubic lice.      Labia:        Right: No rash or lesion.        Left: No rash or lesion.      Vagina: Vaginal discharge present. No erythema, bleeding or lesions.     Cervix: Discharge  present. No cervical motion tenderness, friability, lesion or erythema.     Uterus: Normal.      Adnexa: Right adnexa normal and left adnexa normal.     Rectum: Normal.     Comments: pH = 5  Mod amt of white discharge  Lymphadenopathy:     Head:     Right side of head: No preauricular or posterior auricular adenopathy.     Left side of head: No preauricular or posterior auricular adenopathy.     Cervical: No cervical adenopathy.     Upper Body:     Right upper body: No supraclavicular, axillary or epitrochlear adenopathy.     Left upper body: No  supraclavicular, axillary or epitrochlear adenopathy.     Lower Body: No right inguinal adenopathy. No left inguinal adenopathy.  Skin:    General: Skin is warm and dry.     Findings: No rash.  Neurological:     Mental Status: She is alert and oriented to person, place, and time.      Assessment and Plan:  HAYLEY HORN is a 41 y.o. female presenting to the [facility] Department for STI screening  1. Screening for venereal disease  - Chlamydia/Gonorrhea [facility] - WET PREP FOR TRICH, YEAST, CLUE  2. Trichomoniasis  - metroNIDAZOLE (FLAGYL) 500 MG tablet; Take 1 tablet (500 mg total) by mouth 2 (two) times daily for 7 days.    Patient accepted all screenings including  vaginal CT/GC and wet prep. Declined blood work today Patient meets criteria for HepB screening? No. Ordered? not applicable Patient meets criteria for HepC screening? No. Ordered? not applicable  Treat wet prep per standing order Discussed time line for [facility] results and that patient will be called with positive results and encouraged patient to call if she had not heard in 2 weeks.  Counseled to return or seek care for continued or worsening symptoms Recommended repeat testing in 3 months with positive results. Recommended condom use with all sex  Patient is currently using [facility] to prevent pregnancy.    Return if symptoms worsen or fail to improve, for STI screening.  No future appointments.  Lenice Llamas, [place]

## 2023-04-26 NOTE — Progress Notes (Signed)
 04-26-2023 [facility] Department received a request for medical records from [facility] company for the purpose of HEDIS on patient Olivia Sandoval. Olivia Sandoval DOB: 05-May-1980. Telephone call to Abelina Bachelor at [facility] to clarify request. Per Anastasia Fiedler is requesting a copy of cervical cytology for 12-20-2022 visit. Per review of EMR, cervical cytology testing was not performed on this visit. Abelina Bachelor at [facility] verbally advised of this. No further action required for this ROI. No records released. See copy of ROI scanned in EMR. Herby Abraham RN.
